# Patient Record
Sex: Male | Born: 1960 | Hispanic: No | Marital: Married | Smoking: Former smoker
Health system: Southern US, Community
[De-identification: ages and names within clinical notes are randomized; demographics above are authoritative.]

## PROBLEM LIST (undated history)

## (undated) DIAGNOSIS — G4730 Sleep apnea, unspecified: Secondary | ICD-10-CM

## (undated) DIAGNOSIS — Q249 Congenital malformation of heart, unspecified: Secondary | ICD-10-CM

## (undated) DIAGNOSIS — M1990 Unspecified osteoarthritis, unspecified site: Secondary | ICD-10-CM

## (undated) DIAGNOSIS — I10 Essential (primary) hypertension: Secondary | ICD-10-CM

## (undated) DIAGNOSIS — F32A Depression, unspecified: Secondary | ICD-10-CM

## (undated) HISTORY — DX: Unspecified osteoarthritis, unspecified site: M19.90

## (undated) HISTORY — PX: CARDIAC SURGERY: SHX584

## (undated) HISTORY — DX: Sleep apnea, unspecified: G47.30

## (undated) HISTORY — DX: Congenital malformation of heart, unspecified: Q24.9

## (undated) HISTORY — DX: Depression, unspecified: F32.A

---

## 2013-02-10 ENCOUNTER — Encounter: Payer: Self-pay | Admitting: Cardiovascular Disease

## 2013-02-10 ENCOUNTER — Inpatient Hospital Stay: Payer: Self-pay | Admitting: Specialist

## 2013-02-10 DIAGNOSIS — I2510 Atherosclerotic heart disease of native coronary artery without angina pectoris: Secondary | ICD-10-CM

## 2013-02-10 DIAGNOSIS — I214 Non-ST elevation (NSTEMI) myocardial infarction: Secondary | ICD-10-CM

## 2013-02-10 LAB — COMPREHENSIVE METABOLIC PANEL
Alkaline Phosphatase: 73 U/L (ref 50–136)
Anion Gap: 6 — ABNORMAL LOW (ref 7–16)
BUN: 15 mg/dL (ref 7–18)
Chloride: 108 mmol/L — ABNORMAL HIGH (ref 98–107)
Creatinine: 0.87 mg/dL (ref 0.60–1.30)
EGFR (African American): >60
Osmolality: 278 (ref 275–301)
Potassium: 3.8 mmol/L (ref 3.5–5.1)
SGOT(AST): 45 U/L — ABNORMAL HIGH (ref 15–37)

## 2013-02-10 LAB — DRUG SCREEN, URINE
Amphetamines, Ur Screen: NEGATIVE (ref ?–1000)
Benzodiazepine, Ur Scrn: NEGATIVE (ref ?–200)
Cannabinoid 50 Ng, Ur ~~LOC~~: POSITIVE (ref ?–50)
MDMA (Ecstasy)Ur Screen: NEGATIVE (ref ?–500)
Opiate, Ur Screen: NEGATIVE (ref ?–300)
Phencyclidine (PCP) Ur S: NEGATIVE (ref ?–25)

## 2013-02-10 LAB — CBC
MCHC: 33.5 g/dL (ref 32.0–36.0)
MCV: 84 fL (ref 80–100)
WBC: 7.9 10*3/uL (ref 3.8–10.6)

## 2013-02-10 LAB — CK TOTAL AND CKMB (NOT AT ARMC)
CK, Total: 195 U/L (ref 35–232)
CK-MB: 10.4 ng/mL — ABNORMAL HIGH (ref 0.5–3.6)
CK-MB: 6.1 ng/mL — ABNORMAL HIGH (ref 0.5–3.6)

## 2013-02-10 LAB — APTT: Activated PTT: 33.1 secs (ref 23.6–35.9)

## 2013-02-10 LAB — PROTIME-INR: Prothrombin Time: 13.1 secs (ref 11.5–14.7)

## 2013-02-11 LAB — BASIC METABOLIC PANEL
BUN: 11 mg/dL (ref 7–18)
Calcium, Total: 8 mg/dL — ABNORMAL LOW (ref 8.5–10.1)
Chloride: 107 mmol/L (ref 98–107)
Creatinine: 0.84 mg/dL (ref 0.60–1.30)
EGFR (African American): >60
EGFR (Non-African Amer.): >60
Osmolality: 277 (ref 275–301)
Potassium: 3.6 mmol/L (ref 3.5–5.1)

## 2013-02-11 LAB — CK TOTAL AND CKMB (NOT AT ARMC)
CK, Total: 114 U/L (ref 35–232)
CK-MB: 2.7 ng/mL (ref 0.5–3.6)

## 2013-03-03 ENCOUNTER — Ambulatory Visit: Payer: Self-pay | Admitting: Physician Assistant

## 2013-06-12 ENCOUNTER — Other Ambulatory Visit: Payer: Self-pay | Admitting: Cardiology

## 2013-06-12 LAB — CK TOTAL AND CKMB (NOT AT ARMC)
CK, Total: 109 U/L
CK-MB: 1.7 ng/mL

## 2013-06-12 LAB — TROPONIN I: Troponin-I: 0.8 ng/mL — ABNORMAL HIGH

## 2013-09-04 ENCOUNTER — Ambulatory Visit: Payer: Self-pay | Admitting: General Practice

## 2015-03-22 NOTE — Consult Note (Signed)
   General Aspect 54 yo male with history of tobacco, etoh and substance abuse who presented to the er after being noted to have mildly elevated serum troponin as outpatient of 0.3 after haveing left arm pain for several hours. In the er , his roponin was 11.0.  EKG does not suggest stemi. He is pain free and hemodynamically stable at present. FOrmer cocaine user but noe aat present. States he quit smoking several weeks ago.   Physical Exam:  GEN well developed, well nourished, no acute distress   HEENT PERRL, hearing intact to voice   NECK supple  No masses   RESP clear BS  no use of accessory muscles   CARD Regular rate and rhythm  No murmur   ABD denies tenderness  no liver/spleen enlargement  normal BS   LYMPH negative neck, negative axillae   EXTR negative cyanosis/clubbing, negative edema   SKIN normal to palpation   NEURO cranial nerves intact, motor/sensory function intact   PSYCH A+O to time, place, person   Review of Systems:  Subjective/Chief Complaint chest and left arm pain   General: No Complaints   Skin: No Complaints   ENT: No Complaints   Eyes: No Complaints   Neck: No Complaints   Respiratory: No Complaints   Cardiovascular: Chest pain or discomfort  Tightness   Gastrointestinal: No Complaints   Genitourinary: No Complaints   Vascular: No Complaints   Musculoskeletal: No Complaints   Neurologic: No Complaints   Hematologic: No Complaints   Endocrine: No Complaints   Psychiatric: No Complaints   Review of Systems: All other systems were reviewed and found to be negative   Medications/Allergies Reviewed Medications/Allergies reviewed   EKG:  EKG NSR   Abnormal NSSTTW changes    Hydrochlorothiazide: Angioedema  Triamterene: Angioedema   Impression 54 yo male with history of hypertension and tobacco and cocaine use who presented with chest pain. Has ruled in for a NSTEMI. Hemodynamically stable. Deneis current tobacco or cocaine  use.   Plan 1. Continue heparin and intergrilin 2. Continue asa, betal blcokers and ace i 3. Risk and benefit of cardiac cath explainted. 4. Proceed with cardiac cath. Further recs after procedure.   Electronic Signatures: Dalia HeadingFath, Kenneth A (MD)  (Signed 14-Mar-14 12:38)  Authored: General Aspect/Present Illness, History and Physical Exam, Review of System, EKG , Allergies, Impression/Plan   Last Updated: 14-Mar-14 12:38 by Dalia HeadingFath, Kenneth A (MD)

## 2015-03-22 NOTE — H&P (Signed)
PATIENT NAME:  Sean Durham, Sean Durham MR#:  [id number] DATE OF BIRTH:  1961-01-13  DATE OF ADMISSION:  02/10/2013  PRIMARY CARE PHYSICIAN: The patient does not have one.   CHIEF COMPLAINT: Left arm pain.   HISTORY OF PRESENT ILLNESS: This is a 54 year old male who presents to the Emergency Room due to left arm pain that began yesterday. The patient said he woke up with left arm pain. Also had some tightness in his neck area. He went to work, although his symptoms still persisted, and therefore he went to see [employer], at Dr. Drucie OpitzStrickland's office.  He was seen by a physician assistant there, had some blood work done. He also was noted to be hypertensive and was started on some low-dose Norvasc, which he has not currently filled. His blood work from the doctor's office came back to have a troponin of 0.3. He was therefore called back and told to come to the ER. The patient continues to have some left arm pain, but it has improved. Here in the ER, the patient's troponin was noted to be as high as 11. The patient is suspected to have a non-ST-elevation MI and therefore hospitalist services were contacted for further treatment and evaluation.   REVIEW OF SYSTEMS:  CONSTITUTIONAL: No documented fever. No weight gain or weight loss.  EYES: No blurred or double vision.  ENT: No tinnitus. No postnasal drip. No redness of the oropharynx.  RESPIRATORY: Positive cough. No wheeze. No hemoptysis. No dyspnea.  CARDIOVASCULAR: No chest pain. No orthopnea. No palpitations. No syncope.  GASTROINTESTINAL: No nausea. No vomiting. No diarrhea. No abdominal pain. No melena or hematochezia.  GENITOURINARY: No dysuria or hematuria.  ENDOCRINE: No polyuria or nocturia. No heat or cold intolerance. HEMATOLOGIC: No anemia. No bruising. No bleeding.  INTEGUMENTARY: No rashes. No lesions.  MUSCULOSKELETAL: No arthritis. No swelling. No gout.  NEUROLOGIC: No numbness or tingling. No ataxia. No seizure-type activity.   PSYCHIATRIC: No anxiety. No insomnia. No ADD.   PAST MEDICAL HISTORY:  Is consistent with history of polysubstance abuse including alcohol, cocaine, marijuana and tobacco abuse. Possibly past medical history of undiagnosed hypertension.   ALLERGIES: No known drug allergies.   SOCIAL HISTORY: Used to be a smoker and drinker and also cocaine abuse, but has been clean since January of 2014.   FAMILY HISTORY: The patient's father died from paraplegia and from a traumatic incident. Mother died from MI and vascular disease.   CURRENT MEDICATIONS: He is currently on no medications.   PHYSICAL EXAMINATION:  VITAL SIGNS:  Presently temperature is 97.8, pulse 76, respirations 16, blood pressure 159/103 and sats 98% on room air.  GENERAL: He is anxious-appearing male but in no apparent distress.  HEENT: Atraumatic, normocephalic. Extraocular muscles are intact. Pupils equal and reactive to light. Sclerae anicteric. No conjunctival injection. No pharyngeal erythema.  NECK: Supple. There is no jugular venous distention. No bruits and no lymphadenopathy or  thyromegaly.  HEART: Regular rate and rhythm. No murmurs, no rubs and no clicks.  LUNGS: Clear to auscultation bilaterally. No rales, no rhonchi and no wheezes.  ABDOMEN: Soft, flat, nontender and nondistended. Has good bowel sounds. No hepatosplenomegaly appreciated.  EXTREMITIES: No evidence of any cyanosis, clubbing or peripheral edema. Has +2 pedal and radial pulses bilaterally.  NEUROLOGICAL: The patient is alert, awake and oriented x 3 with no focal motor or sensory deficits appreciated bilaterally.  SKIN: Moist and warm with no rashes appreciated. LYMPHATIC:  No cervical or axillary lymphadenopathy.  LABORATORY DATA:  Serum glucose 99, BUN 15, creatinine 0.8, sodium 139, potassium 3.8, chloride 108, bicarbonate 25.  LFTs are within normal limits. Troponin 11, CK-MB 10.4 and total CK 195. Urine tox is positive for cannabis. White cell count  7.9, hemoglobin 14.2, hematocrit 42.6 and platelet count 337. INR 1.   DIAGNOSTICS:  The patient did have an EKG done which showed normal sinus rhythm with normal axis and no evidence of any acute ST or T wave changes.   The patient also had a chest x-ray done which showed no evidence of acute cardiopulmonary disease.   ASSESSMENT AND PLAN: This is a 54 year old male with a history of alcohol abuse, tobacco abuse and also cocaine abuse who presents to the hospital with left arm pain and also some neck pain and noted to have an elevated troponin of 11 and possibly a non-ST-elevation myocardial infarction.  1.  Non-ST-elevation myocardial infarction. This is the likely diagnosis given the patient's elevated troponin and left arm pain. He does have significant risk factors given history of tobacco abuse and alcohol abuse. He also has a strong family history of heart disease in his mother. For now I will place the patient on aspirin, heparin drip, Integrilin drip, put him on beta blocker, continue some low dose ACE inhibitor, and statin. We will get a 2-dimensional echocardiogram. I discussed the case with Dr. Lady Gary who will see the patient and possibly do a cardiac catheterization later today. I will go ahead and cycle his cardiac markers too.  2.  Malignant hypertension. This is likely secondary to undiagnosed hypertension which has not been treated and also complicated with possible underlying anxiety. I will give him some Xanax for his anxiety and start him on metoprolol for blood pressure along with lisinopril. We will also place him on IV hydralazine. He likely will need to be on some antihypertensives prior to discharge.  3.  Anxiety. Continue with some p.r.n. Xanax.   CODE STATUS: The patient is a FULL CODE.   TIME SPENT: 50 minutes.  ____________________________ Rolly Pancake. Cherlynn Kaiser, MD vjs:sb D: 02/10/2013 11:19:48 ET T: 02/10/2013 12:47:47 ET JOB#: [id number]  cc: Rolly Pancake. Cherlynn Kaiser, MD,  <Dictator> Houston Siren MD ELECTRONICALLY SIGNED 02/13/2013 8:17

## 2015-03-22 NOTE — Discharge Summary (Signed)
PATIENT NAME:  Sean Durham, Sean Durham MR#:  [id number] DATE OF BIRTH:  1961/08/06  DATE OF ADMISSION:  02/10/2013 DATE OF DISCHARGE:  02/11/2013  For a detailed note, please take a look at the history and physical done on admission by me yesterday.   DISCHARGE DIAGNOSES: As follows:  1.  Non-ST-elevation myocardial infarction, status post cardiac catheterization with stent to the mid circumflex.  2.  Malignant hypertension.  3.  Anxiety.  4.  History of polysubstance abuse.   DIET: The patient is being discharged on a low sodium, low fat diet.   ACTIVITY: As tolerated.   FOLLOWUP: With Dr. Lady GaryFath in the next 1 to 2 weeks.  DISCHARGE MEDICATIONS: As follows: Aspirin 81 mg daily, Plavix 75 mg daily, Pravachol 40 mg daily, lisinopril 5 mg daily, metoprolol tartrate 25 mg b.i.d.   CONSULTANTS DURING THE HOSPITAL COURSE: Dr. Mariel KanskyKen Fath from cardiology.   PERTINENT STUDIES DONE DURING THE HOSPITAL COURSE: As follows: A cardiac catheterization done on 02/10/2013 showing 25% stenosis in the mid LAD, proximal circumflex 25% stenosis, mid circumflex 90% stenosis, proximal RCA 20% stenosis and mid RCA 25% stenosis. The patient underwent a bare metal stent placement to the mid circumflex.   HOSPITAL COURSE: This is a 54 year old male with medical problems as mentioned above, who presented to the hospital with left arm pain and chest pain noted to have an elevated troponin of 11.    1.  Non-ST-elevation MI. This was a likely diagnosis given the patient's presenting symptoms of left arm pain and neck tightness and elevated troponin. Initially, the patient was started on aspirin, heparin drip, Integrilin drip along with beta blockers, ACE inhibitors and statin. The patient was seen by cardiology and underwent a cardiac catheterization and was found to have a mid circumflex stenosis to which a bare metal stent was placed. The patient was observed overnight in the CCU after the stent placement and has had no further  episodes of chest pain or any neck pain or left arm pain. He is; therefore, clinically stable, being discharged home on aspirin, beta blocker, statin, Plavix and ACE inhibitor as stated.  2.  Anxiety. The patient received some p.r.n. Xanax, but did not require it upon discharge.  3.  Malignant hypertension. The patient had significantly elevated blood pressures while in the hospital. He probably has undiagnosed hypertension, which has not been treated. He presently is being discharged on metoprolol and lisinopril as stated. This can be further addressed as an outpatient by his primary care physician.  4.  Hyperlipidemia. The patient was started on a statin, which he will continue to take given his non-ST elevation MI.   CODE STATUS: The patient is a full code.   TIME SPENT: 40 minutes.    ____________________________ Rolly PancakeVivek J. Cherlynn KaiserSainani, MD vjs:aw D: 02/11/2013 14:17:04 ET T: 02/12/2013 09:39:15 ET JOB#: [id number]  cc: Rolly PancakeVivek J. Cherlynn KaiserSainani, MD, <Dictator> Darlin PriestlyKenneth A. Lady GaryFath, MD Houston SirenVIVEK J SAINANI MD ELECTRONICALLY SIGNED 02/13/2013 8:18

## 2018-06-07 ENCOUNTER — Emergency Department
Admission: EM | Admit: 2018-06-07 | Discharge: 2018-06-07 | Disposition: A | Discharge status: Home / Self Care | Payer: PRIVATE HEALTH INSURANCE | Attending: Emergency Medicine | Admitting: Emergency Medicine

## 2018-06-07 ENCOUNTER — Encounter: Payer: Self-pay | Admitting: Emergency Medicine

## 2018-06-07 ENCOUNTER — Other Ambulatory Visit: Payer: Self-pay

## 2018-06-07 DIAGNOSIS — G4710 Hypersomnia, unspecified: Secondary | ICD-10-CM | POA: Insufficient documentation

## 2018-06-07 DIAGNOSIS — G47411 Narcolepsy with cataplexy: Secondary | ICD-10-CM

## 2018-06-07 DIAGNOSIS — G47421 Narcolepsy in conditions classified elsewhere with cataplexy: Secondary | ICD-10-CM | POA: Insufficient documentation

## 2018-06-07 LAB — COMPREHENSIVE METABOLIC PANEL
ALK PHOS: 49 U/L (ref 38–126)
ALT: 46 U/L — ABNORMAL HIGH (ref 0–44)
AST: 25 U/L (ref 15–41)
Albumin: 4.1 g/dL (ref 3.5–5.0)
Anion gap: 5 (ref 5–15)
BILIRUBIN TOTAL: 0.4 mg/dL (ref 0.3–1.2)
BUN: 11 mg/dL (ref 6–20)
CALCIUM: 9.3 mg/dL (ref 8.9–10.3)
CO2: 31 mmol/L (ref 22–32)
Chloride: 105 mmol/L (ref 98–111)
Creatinine, Ser: 1.03 mg/dL (ref 0.61–1.24)
GFR calc Af Amer: >60 mL/min (ref >60)
GLUCOSE: 104 mg/dL — AB (ref 70–99)
Potassium: 4.1 mmol/L (ref 3.5–5.1)
Sodium: 141 mmol/L (ref 135–145)
TOTAL PROTEIN: 7.2 g/dL (ref 6.5–8.1)

## 2018-06-07 LAB — CBC
HEMATOCRIT: 45.8 % (ref 40.0–52.0)
HEMOGLOBIN: 15.5 g/dL (ref 13.0–18.0)
MCH: 28.8 pg (ref 26.0–34.0)
MCHC: 33.8 g/dL (ref 32.0–36.0)
MCV: 85.2 fL (ref 80.0–100.0)
Platelets: 313 10*3/uL (ref 150–440)
RBC: 5.37 MIL/uL (ref 4.40–5.90)
RDW: 14.4 % (ref 11.5–14.5)
WBC: 6 10*3/uL (ref 3.8–10.6)

## 2018-06-07 MED ORDER — MODAFINIL 200 MG PO TABS: 200.0000 mg | ORAL_TABLET | Freq: Every day | ORAL | 0 refills | Status: DC

## 2018-06-07 NOTE — ED Provider Notes (Signed)
[facility] Emergency Department Provider Note   ____________________________________________    I have reviewed the triage vital signs and the nursing notes.   HISTORY  Chief Complaint Weakness     HPI Sean Durham is a 57 y.o. male who presents with complaints of falling asleep at all times.  Patient reports over the last month he finds himself falling asleep without meaning to while he was in the middle of an activity.  Typically happens when he is sitting down, sometimes he will be looking at his phone and then wake up an hour later.  He denies neuro complaints, no palpitations or chest pain.  No history of narcolepsy.   History reviewed. No pertinent past medical history.  There are no active problems to display for this patient.   History reviewed. No pertinent surgical history.  Prior to Admission medications   Medication Sig Start Date End Date Taking? Authorizing Provider  modafinil (PROVIGIL) 200 MG tablet Take 1 tablet (200 mg total) by mouth daily. 06/07/18 07/07/18  Jene Every, MD     Allergies Hydrochlorothiazide w-triamterene and Statins  No family history on file.  Social History No alcohol or drugs does not smoke Review of Systems  Constitutional: No fever/chills Eyes: No visual changes.  ENT: No sore throat. Cardiovascular: Denies chest pain. Respiratory: Denies shortness of breath. Gastrointestinal: No abdominal pain.  No nausea, no vomiting.   Genitourinary: Negative for dysuria. Musculoskeletal: Negative for back pain. Skin: Negative for rash. Neurological: Negative for headaches or weakness   ____________________________________________   PHYSICAL EXAM:  VITAL SIGNS: ED Triage Vitals [06/07/18 0910]  Enc Vitals Group     BP (!) 189/109     Pulse Rate 76     Resp 20     Temp 97.7 F (36.5 C)     Temp Source Oral     SpO2 95 %     Weight 108.9 kg (240 lb)     Height 1.753 m (5\' 9" )     Head  Circumference      Peak Flow      Pain Score 0     Pain Loc      Pain Edu?      Excl. in GC?     Constitutional: Alert and oriented. No acute distress. Pleasant and interactive Eyes: Conjunctivae are normal.   Nose: No congestion/rhinnorhea. Mouth/Throat: Mucous membranes are moist.    Cardiovascular: Normal rate, regular rhythm. Grossly normal heart sounds.  Good peripheral circulation. Respiratory: Normal respiratory effort.  No retractions. Lungs CTAB. Gastrointestinal: Soft and nontender. No distention.    Musculoskeletal: No lower extremity tenderness nor edema.  Warm and well perfused Neurologic:  Normal speech and language. No gross focal neurologic deficits are appreciated.  Skin:  Skin is warm, dry and intact. No rash noted. Psychiatric: Mood and affect are normal. Speech and behavior are normal.  ____________________________________________   LABS (all labs ordered are listed, but only abnormal results are displayed)  Labs Reviewed  COMPREHENSIVE METABOLIC PANEL - Abnormal; Notable for the following components:      Result Value   Glucose, Bld 104 (*)    ALT 46 (*)    All other components within normal limits  CBC   ____________________________________________  EKG  ED ECG REPORT I, Jene Every, the attending physician, personally viewed and interpreted this ECG.  Date: 06/07/2018  Rhythm: normal sinus rhythm QRS Axis: normal Intervals: normal ST/T Wave abnormalities: normal Narrative Interpretation: no evidence of  acute ischemia  ____________________________________________  RADIOLOGY  None ____________________________________________   PROCEDURES  Procedure(s) performed: No  Procedures   Critical Care performed: No ____________________________________________   INITIAL IMPRESSION / ASSESSMENT AND PLAN / ED COURSE  Pertinent labs & imaging results that were available during my care of the patient were reviewed by me and considered  in my medical decision making (see chart for details).  Patient well-appearing in no acute distress.  Symptoms do not appear consistent with syncope or cardiac.  Certainly his symptoms are concerning for narcolepsy although this would be unusual at his age.  Seems to have episodes of cataplexy given normal work-up I think the patient is appropriate for discharge, will try Provigil and have the patient follow-up closely with internal medicine for further work-up      ____________________________________________   FINAL CLINICAL IMPRESSION(S) / ED DIAGNOSES  Final diagnoses:  Primary narcolepsy with cataplexy        Note:  This document was prepared using Dragon voice recognition software and may include unintentional dictation errors.    Jene EveryKinner, [place] Mallozzi, MD 06/07/18 [phone]

## 2018-06-07 NOTE — ED Notes (Addendum)

## 2018-06-07 NOTE — ED Triage Notes (Signed)
Pt reports has been randomly falling asleep for the past few months. Pt states that he can be be doing anything and then he just falls asleep like someone flipped a switch. Pt denies pain or other sx's.

## 2018-06-20 ENCOUNTER — Ambulatory Visit
Admission: EM | Admit: 2018-06-20 | Discharge: 2018-06-20 | Disposition: A | Discharge status: Home / Self Care | Payer: PRIVATE HEALTH INSURANCE | Attending: Family Medicine | Admitting: Family Medicine

## 2018-06-20 ENCOUNTER — Encounter: Payer: Self-pay | Admitting: Emergency Medicine

## 2018-06-20 ENCOUNTER — Other Ambulatory Visit: Payer: Self-pay

## 2018-06-20 DIAGNOSIS — H10021 Other mucopurulent conjunctivitis, right eye: Secondary | ICD-10-CM

## 2018-06-20 DIAGNOSIS — L03213 Periorbital cellulitis: Secondary | ICD-10-CM

## 2018-06-20 DIAGNOSIS — H00012 Hordeolum externum right lower eyelid: Secondary | ICD-10-CM

## 2018-06-20 HISTORY — DX: Essential (primary) hypertension: I10

## 2018-06-20 MED ORDER — ERYTHROMYCIN 5 MG/GM OP OINT: TOPICAL_OINTMENT | OPHTHALMIC | 1 refills | Status: DC

## 2018-06-20 MED ORDER — CEFAZOLIN SODIUM 1 G IJ SOLR
1.0000 g | Freq: Once | INTRAMUSCULAR | Status: AC
  Administered 2018-06-20: 1 g via INTRAMUSCULAR

## 2018-06-20 MED ORDER — AMOXICILLIN-POT CLAVULANATE 875-125 MG PO TABS: 1.0000 | ORAL_TABLET | Freq: Two times a day (BID) | ORAL | 0 refills | Status: DC

## 2018-06-20 NOTE — ED Triage Notes (Signed)
Patient in today c/o right eye swelling, pain and redness. Patient states his eye was matted shut this morning. Patient states symptoms began on Saturday (06/18/18) with just a little scratching.

## 2018-06-20 NOTE — ED Provider Notes (Signed)
[facility]    CSN: [id number] Arrival date & time: 06/20/18  1530     History   Chief Complaint Chief Complaint  Patient presents with  . Eye Pain    right    HPI Sean Durham is a 57 y.o. male.   57 yo male with a c/o right lower eyelid swelling, redness, and tenderness associated also with eye drainage and redness. Denies any trauma, foreign body, fevers, chills.   The history is provided by the patient.  Eye Pain     Past Medical History:  Diagnosis Date  . Hypertension     There are no active problems to display for this patient.   History reviewed. No pertinent surgical history.     Home Medications    Prior to Admission medications   Medication Sig Start Date End Date Taking? Authorizing Provider  lisinopril (PRINIVIL,ZESTRIL) 10 MG tablet Take 1 tablet by mouth daily. 08/16/17 08/16/18 Yes [provider]  modafinil (PROVIGIL) 200 MG tablet Take 1 tablet (200 mg total) by mouth daily. 06/07/18 07/07/18 Yes Jene EveryKinner, Robert, MD  amoxicillin-clavulanate (AUGMENTIN) 875-125 MG tablet Take 1 tablet by mouth 2 (two) times daily. 06/20/18   Payton Mccallumonty, Cleva Camero, MD  erythromycin ophthalmic ointment Place a 1/2 inch ribbon of ointment into the lower eyelid tid 06/20/18   Payton Mccallumonty, Latoshia Monrroy, MD    Family History Family History  Problem Relation Age of Onset  . Heart disease Mother   . Aneurysm Mother        back side of spine  . Other Father        fall - broken neck  . Emphysema Father     Social History Social History   Tobacco Use  . Smoking status: Former Smoker    Last attempt to quit: 06/20/2010    Years since quitting: 8.0  . Smokeless tobacco: Never Used  Substance Use Topics  . Alcohol use: Yes    Comment: rarely  . Drug use: Never     Allergies   Hydrochlorothiazide w-triamterene; Nicotine; and Statins   Review of Systems Review of Systems  Eyes: Positive for pain.     Physical Exam Triage Vital Signs ED Triage Vitals  [06/20/18 1548]  Enc Vitals Group     BP (!) 164/98     Pulse Rate 96     Resp 16     Temp 98.4 F (36.9 C)     Temp Source Oral     SpO2 96 %     Weight 240 lb (108.9 kg)     Height 5\' 9"  (1.753 m)     Head Circumference      Peak Flow      Pain Score 6     Pain Loc      Pain Edu?      Excl. in GC?    No data found.  Updated Vital Signs BP (!) 164/98 (BP Location: Left Arm)   Pulse 96   Temp 98.4 F (36.9 C) (Oral)   Resp 16   Ht 5\' 9"  (1.753 m)   Wt 240 lb (108.9 kg)   SpO2 96%   BMI 35.44 kg/m   Visual Acuity Right Eye Distance: 20/30(corrected) Left Eye Distance: 20/20(corrected) Bilateral Distance: 20/20(corrected)  Right Eye Near:   Left Eye Near:    Bilateral Near:     Physical Exam  Constitutional: He appears well-developed and well-nourished. No distress.  Eyes: Pupils are equal, round, and reactive to light.  EOM are normal. Right conjunctiva is injected.  Right lower eyelid edematous, erythematous and tender to palpation  Skin: He is not diaphoretic.  Nursing note and vitals reviewed.    UC Treatments / Results  Labs (all labs ordered are listed, but only abnormal results are displayed) Labs Reviewed - No data to display  EKG None  Radiology No results found.  Procedures Procedures (including critical care time)  Medications Ordered in UC Medications  ceFAZolin (ANCEF) injection 1 g (has no administration in time range)    Initial Impression / Assessment and Plan / UC Course  I have reviewed the triage vital signs and the nursing notes.  Pertinent labs & imaging results that were available during my care of the patient were reviewed by me and considered in my medical decision making (see chart for details).     Final Clinical Impressions(s) / UC Diagnoses   Final diagnoses:  Preseptal cellulitis of right eye  Other mucopurulent conjunctivitis of right eye  Hordeolum externum of right lower eyelid    ED Prescriptions     Medication Sig Dispense Auth. Provider   amoxicillin-clavulanate (AUGMENTIN) 875-125 MG tablet Take 1 tablet by mouth 2 (two) times daily. 20 tablet Payton Mccallum, MD   erythromycin ophthalmic ointment Place a 1/2 inch ribbon of ointment into the lower eyelid tid 3.5 g Payton Mccallum, MD     1. diagnosis reviewed with patient; patient given ancef 1gm IM x 1 2. rx as per orders above; reviewed possible side effects, interactions, risks and benefits  3. Recommend supportive treatment with warm compresses 4. Follow-up prn if symptoms worsen or don't improve  Controlled Substance Prescriptions [place] Controlled Substance Registry consulted? Not Applicable   Payton Mccallum, MD 06/20/18 1655

## 2018-11-15 ENCOUNTER — Encounter: Payer: Self-pay | Admitting: Urology

## 2018-11-15 ENCOUNTER — Ambulatory Visit: Payer: Self-pay | Admitting: Urology

## 2020-02-27 ENCOUNTER — Other Ambulatory Visit: Payer: Self-pay

## 2020-02-27 ENCOUNTER — Encounter: Payer: Self-pay | Admitting: Emergency Medicine

## 2020-02-27 ENCOUNTER — Ambulatory Visit
Admission: EM | Admit: 2020-02-27 | Discharge: 2020-02-27 | Disposition: A | Discharge status: Home / Self Care | Payer: HRSA Program | Attending: Family Medicine | Admitting: Family Medicine

## 2020-02-27 DIAGNOSIS — Z7901 Long term (current) use of anticoagulants: Secondary | ICD-10-CM | POA: Diagnosis not present

## 2020-02-27 DIAGNOSIS — I10 Essential (primary) hypertension: Secondary | ICD-10-CM | POA: Insufficient documentation

## 2020-02-27 DIAGNOSIS — Z79899 Other long term (current) drug therapy: Secondary | ICD-10-CM | POA: Diagnosis not present

## 2020-02-27 DIAGNOSIS — U071 COVID-19: Secondary | ICD-10-CM | POA: Diagnosis not present

## 2020-02-27 DIAGNOSIS — I252 Old myocardial infarction: Secondary | ICD-10-CM | POA: Diagnosis not present

## 2020-02-27 DIAGNOSIS — Z87891 Personal history of nicotine dependence: Secondary | ICD-10-CM | POA: Diagnosis not present

## 2020-02-27 DIAGNOSIS — R5381 Other malaise: Secondary | ICD-10-CM

## 2020-02-27 DIAGNOSIS — E785 Hyperlipidemia, unspecified: Secondary | ICD-10-CM | POA: Diagnosis not present

## 2020-02-27 DIAGNOSIS — I2510 Atherosclerotic heart disease of native coronary artery without angina pectoris: Secondary | ICD-10-CM | POA: Diagnosis not present

## 2020-02-27 LAB — CBC WITH DIFFERENTIAL/PLATELET
Abs Immature Granulocytes: 0.06 10*3/uL (ref 0.00–0.07)
Basophils Absolute: 0 10*3/uL (ref 0.0–0.1)
Basophils Relative: 0 %
Eosinophils Absolute: 0.1 10*3/uL (ref 0.0–0.5)
Eosinophils Relative: 1 %
HCT: 45.8 % (ref 39.0–52.0)
Hemoglobin: 14.8 g/dL (ref 13.0–17.0)
Immature Granulocytes: 1 %
Lymphocytes Relative: 25 %
Lymphs Abs: 1.6 10*3/uL (ref 0.7–4.0)
MCH: 27.3 pg (ref 26.0–34.0)
MCHC: 32.3 g/dL (ref 30.0–36.0)
MCV: 84.3 fL (ref 80.0–100.0)
Monocytes Absolute: 0.8 10*3/uL (ref 0.1–1.0)
Monocytes Relative: 13 %
Neutro Abs: 3.9 10*3/uL (ref 1.7–7.7)
Neutrophils Relative %: 60 %
Platelets: 408 10*3/uL — ABNORMAL HIGH (ref 150–400)
RBC: 5.43 MIL/uL (ref 4.22–5.81)
RDW: 13.5 % (ref 11.5–15.5)
WBC: 6.5 10*3/uL (ref 4.0–10.5)
nRBC: 0 % (ref 0.0–0.2)

## 2020-02-27 LAB — COMPREHENSIVE METABOLIC PANEL
ALT: 29 U/L (ref 0–44)
AST: 24 U/L (ref 15–41)
Albumin: 3.6 g/dL (ref 3.5–5.0)
Alkaline Phosphatase: 59 U/L (ref 38–126)
Anion gap: 9 (ref 5–15)
BUN: 15 mg/dL (ref 6–20)
CO2: 28 mmol/L (ref 22–32)
Calcium: 9 mg/dL (ref 8.9–10.3)
Chloride: 101 mmol/L (ref 98–111)
Creatinine, Ser: 0.9 mg/dL (ref 0.61–1.24)
GFR calc Af Amer: >60 mL/min (ref >60)
GFR calc non Af Amer: >60 mL/min (ref >60)
Glucose, Bld: 104 mg/dL — ABNORMAL HIGH (ref 70–99)
Potassium: 4.3 mmol/L (ref 3.5–5.1)
Sodium: 138 mmol/L (ref 135–145)
Total Bilirubin: 0.5 mg/dL (ref 0.3–1.2)
Total Protein: 7.5 g/dL (ref 6.5–8.1)

## 2020-02-27 LAB — LIPASE, BLOOD: Lipase: 23 U/L (ref 11–51)

## 2020-02-27 NOTE — ED Triage Notes (Signed)
Pt states that he feels bad like he has the flu is all he can say. But he says he is anxious and does not feel him self. He has had thought of hurting himself but no plan. Pt can not pin point one thing that is bothering him. He states he was tested for covid recently and was negative. He states he is about to loss his job.

## 2020-02-27 NOTE — Discharge Instructions (Signed)
Labs negative.  Awaiting COVID test results.  Please follow up with [facility] clinic.  Take care  Dr. Adriana Simas

## 2020-02-27 NOTE — ED Provider Notes (Signed)
[facility]    CSN: [id number] Arrival date & time: 02/27/20  1227      History   Chief Complaint Chief Complaint  Patient presents with  . Anxiety   HPI   59 year old male presents with nonspecific complaints.  Patient states that he has not been feeling well since March 18.  Patient has difficulty explaining what exactly is bothering him.  Patient states that he feels like he has the flu.  When asked to further explain this, he requires prompting with questions to give answers about symptoms.  He reports dark urine, change in taste.  He reports fever, T-max 100.  No documented fever currently.  Denies abdominal pain.  Denies chest pain.  Reports that he has had some dizziness and shortness of breath.  He had a Covid test that was negative recently.  He was notified of negative results on 3/23.  No reported sick contacts.  No relieving factors.  Denies respiratory symptoms.  No other reported symptoms.  No other complaints.   PMH: CAD (coronary artery disease)  status-post non ST elevation with myocardial infarction. status-post PCI with placement of a bare metal stent in the mid circumflex.   Hypertension    Hyperlipidemia    Myocardial infarction (CMS-HCC)    Polysubstance abuse (CMS-HCC)    Anxiety      Surgical Hx: CARDIAC CATHETERIZATION      CORONARY ANGIOPLASTY       Home Medications    Prior to Admission medications   Medication Sig Start Date End Date Taking? Authorizing Provider  lisinopril (PRINIVIL,ZESTRIL) 10 MG tablet Take 1 tablet by mouth daily. 08/16/17 02/27/20 Yes [provider]  amoxicillin-clavulanate (AUGMENTIN) 875-125 MG tablet Take 1 tablet by mouth 2 (two) times daily. 06/20/18   Payton Mccallum, MD  erythromycin ophthalmic ointment Place a 1/2 inch ribbon of ointment into the lower eyelid tid 06/20/18   Payton Mccallum, MD  modafinil (PROVIGIL) 200 MG tablet Take 1 tablet (200 mg total) by mouth daily. 06/07/18 07/07/18   Jene Every, MD    Family History Family History  Problem Relation Age of Onset  . Heart disease Mother   . Aneurysm Mother        back side of spine  . Other Father        fall - broken neck  . Emphysema Father     Social History Social History   Tobacco Use  . Smoking status: Former Smoker    Quit date: 06/20/2010    Years since quitting: 9.6  . Smokeless tobacco: Current User    Types: Chew  Substance Use Topics  . Alcohol use: Yes    Comment: rarely  . Drug use: Never     Allergies   Hydrochlorothiazide w-triamterene, Nicotine, and Statins   Review of Systems Review of Systems Per HPI  Physical Exam Triage Vital Signs ED Triage Vitals  Enc Vitals Group     BP 02/27/20 1312 (!) 142/95     Pulse Rate 02/27/20 1312 93     Resp 02/27/20 1312 18     Temp 02/27/20 1312 98.4 F (36.9 C)     Temp Source 02/27/20 1312 Oral     SpO2 02/27/20 1312 97 %     Weight 02/27/20 1314 240 lb 1.3 oz (108.9 kg)     Height 02/27/20 1314 5\' 9"  (1.753 m)     Head Circumference --      Peak Flow --  Pain Score 02/27/20 1318 0     Pain Loc --      Pain Edu? --      Excl. in [place]? --    Updated Vital Signs BP (!) 142/95 (BP Location: Left Arm)   Pulse 93   Temp 98.4 F (36.9 C) (Oral)   Resp 18   Ht 5\' 9"  (1.753 m)   Wt 108.9 kg   SpO2 97%   BMI 35.45 kg/m   Visual Acuity Right Eye Distance:   Left Eye Distance:   Bilateral Distance:    Right Eye Near:   Left Eye Near:    Bilateral Near:     Physical Exam Vitals and nursing note reviewed.  Constitutional:      General: He is not in acute distress.    Appearance: Normal appearance. He is not ill-appearing.  HENT:     Head: Normocephalic and atraumatic.  Eyes:     General:        Right eye: No discharge.        Left eye: No discharge.     Conjunctiva/sclera: Conjunctivae normal.  Cardiovascular:     Rate and Rhythm: Normal rate and regular rhythm.     Heart sounds: No murmur.  Pulmonary:      Effort: Pulmonary effort is normal.     Breath sounds: Normal breath sounds. No wheezing, rhonchi or rales.  Abdominal:     General: There is no distension.     Palpations: Abdomen is soft.     Tenderness: There is no abdominal tenderness. There is no guarding or rebound.  Neurological:     Mental Status: He is alert.    UC Treatments / Results  Labs (all labs ordered are listed, but only abnormal results are displayed) Labs Reviewed  CBC WITH DIFFERENTIAL/PLATELET - Abnormal; Notable for the following components:      Result Value   Platelets 408 (*)    All other components within normal limits  COMPREHENSIVE METABOLIC PANEL - Abnormal; Notable for the following components:   Glucose, Bld 104 (*)    All other components within normal limits  SARS CORONAVIRUS 2 (TAT 6-24 HRS)  LIPASE, BLOOD    EKG   Radiology No results found.  Procedures Procedures (including critical care time)  Medications Ordered in UC Medications - No data to display  Initial Impression / Assessment and Plan / UC Course  I have reviewed the triage vital signs and the nursing notes.  Pertinent labs & imaging results that were available during my care of the patient were reviewed by me and considered in my medical decision making (see chart for details).    59 year old male presents with malaise.  Labs unremarkable.  Awaiting Covid test results.  Supportive care.  Advised close follow-up with PCP.    Final Clinical Impressions(s) / UC Diagnoses   Final diagnoses:  Malaise     Discharge Instructions     Labs negative.  Awaiting COVID test results.  Please follow up with [facility] clinic.  Take care  Dr. Lacinda Axon    ED Prescriptions    None     PDMP not reviewed this encounter.   Coral Spikes, [place] 02/27/20 1622

## 2020-02-28 LAB — SARS CORONAVIRUS 2 (TAT 6-24 HRS): SARS Coronavirus 2: POSITIVE — AB

## 2020-02-29 ENCOUNTER — Telehealth: Payer: Self-pay | Admitting: Physician Assistant

## 2020-02-29 NOTE — Telephone Encounter (Signed)
Called to discuss with Sean Durham about Covid symptoms and the use of bamlanivimab, a monoclonal antibody infusion for those with mild to moderate Covid symptoms and at a high risk of hospitalization.     Pt is qualified for this infusion at the [facility] infusion center due to co-morbid conditions and/or a member of an at-risk group, however wants to think about it. Symptoms tier reviewed as well as criteria for ending isolation.  Symptoms reviewed that would warrant ED/Hospital evaluation. Preventative practices reviewed. Patient verbalized understanding. Patient advised to call back if he decides that he does want to get infusion. Callback number to the infusion center given. Patient advised to go to Urgent care or ED with severe symptoms.   Patient reports his symptoms started on March 17th or 19th however had negative COVID test on 23th. He went to urgent care on 30th and tested positive. Will count his symptoms onset from 24th given prior negative test.   Hx of CAD and HTN.   Manson Passey, [place]

## 2020-06-25 ENCOUNTER — Ambulatory Visit (INDEPENDENT_AMBULATORY_CARE_PROVIDER_SITE_OTHER): Payer: 59

## 2020-06-25 ENCOUNTER — Ambulatory Visit
Admission: RE | Admit: 2020-06-25 | Discharge: 2020-06-25 | Disposition: A | Discharge status: Home / Self Care | Payer: 59 | Source: Ambulatory Visit | Attending: Family Medicine | Admitting: Family Medicine

## 2020-06-25 ENCOUNTER — Other Ambulatory Visit: Payer: Self-pay

## 2020-06-25 ENCOUNTER — Ambulatory Visit
Admission: EM | Admit: 2020-06-25 | Discharge: 2020-06-25 | Disposition: A | Discharge status: Home / Self Care | Payer: 59 | Attending: Family Medicine | Admitting: Family Medicine

## 2020-06-25 DIAGNOSIS — Z79899 Other long term (current) drug therapy: Secondary | ICD-10-CM | POA: Diagnosis not present

## 2020-06-25 DIAGNOSIS — Z95818 Presence of other cardiac implants and grafts: Secondary | ICD-10-CM | POA: Diagnosis not present

## 2020-06-25 DIAGNOSIS — Z888 Allergy status to other drugs, medicaments and biological substances status: Secondary | ICD-10-CM | POA: Insufficient documentation

## 2020-06-25 DIAGNOSIS — Z87891 Personal history of nicotine dependence: Secondary | ICD-10-CM | POA: Diagnosis not present

## 2020-06-25 DIAGNOSIS — I10 Essential (primary) hypertension: Secondary | ICD-10-CM | POA: Diagnosis not present

## 2020-06-25 DIAGNOSIS — J988 Other specified respiratory disorders: Secondary | ICD-10-CM

## 2020-06-25 DIAGNOSIS — R05 Cough: Secondary | ICD-10-CM | POA: Diagnosis present

## 2020-06-25 DIAGNOSIS — Z20822 Contact with and (suspected) exposure to covid-19: Secondary | ICD-10-CM | POA: Insufficient documentation

## 2020-06-25 DIAGNOSIS — R52 Pain, unspecified: Secondary | ICD-10-CM | POA: Diagnosis not present

## 2020-06-25 DIAGNOSIS — J029 Acute pharyngitis, unspecified: Secondary | ICD-10-CM | POA: Insufficient documentation

## 2020-06-25 DIAGNOSIS — B9789 Other viral agents as the cause of diseases classified elsewhere: Secondary | ICD-10-CM | POA: Diagnosis not present

## 2020-06-25 LAB — SARS CORONAVIRUS 2 (TAT 6-24 HRS): SARS Coronavirus 2: NEGATIVE

## 2020-06-25 MED ORDER — IPRATROPIUM BROMIDE 0.06 % NA SOLN: 2.0000 | Freq: Four times a day (QID) | NASAL | 0 refills | Status: DC | PRN

## 2020-06-25 MED ORDER — BENZONATATE 200 MG PO CAPS: 200.0000 mg | ORAL_CAPSULE | Freq: Three times a day (TID) | ORAL | 0 refills | Status: DC | PRN

## 2020-06-25 NOTE — Discharge Instructions (Addendum)
Rest.  Lots of fluids.  Awaiting on COVID test result.  Medication as prescribed.  Take care  Dr. Adriana Simas

## 2020-06-25 NOTE — ED Triage Notes (Signed)
Patient states that he has been coughing, irritated throat, body aches, chills, sweats and runny nose since Saturday. Patient states that he took Mucinex and helped some.

## 2020-06-25 NOTE — ED Notes (Signed)
Patient has been approved for CPT 71250. Obtained via [facility] . Valid for 06/25/2020 through 09/23/2020. Case Auth #[id number].

## 2020-06-25 NOTE — ED Provider Notes (Signed)
[facility]    CSN: [id number] Arrival date & time: 06/25/20  0831      History   Chief Complaint Chief Complaint  Patient presents with  . Cough   HPI  59 year old male presents with respiratory symptoms.  Symptoms started on Saturday.  He reports cough, sore throat, body aches, chills, sweats, runny nose.  No documented fever.  He has taken some Mucinex with some improvement.  No reported sick contacts.  No known inciting factor.  Patient states that he feels slightly improved today.  He is a former smoker.  No other associated symptoms.  No other complaints.  Past Medical History:  Diagnosis Date  . Hypertension    Past Surgical History:  Procedure Laterality Date  . CARDIAC SURGERY     stent placement   Home Medications    Prior to Admission medications   Medication Sig Start Date End Date Taking? Authorizing Provider  lisinopril (PRINIVIL,ZESTRIL) 10 MG tablet Take 1 tablet by mouth daily. 08/16/17 06/25/20 Yes [provider]  metoprolol succinate (TOPROL-XL) 25 MG 24 hr tablet Take by mouth. 06/06/19  Yes [provider]  benzonatate (TESSALON) 200 MG capsule Take 1 capsule (200 mg total) by mouth 3 (three) times daily as needed for cough. 06/25/20   Tommie Sams, DO  ipratropium (ATROVENT) 0.06 % nasal spray Place 2 sprays into both nostrils 4 (four) times daily as needed for rhinitis. 06/25/20   Tommie Sams, DO  modafinil (PROVIGIL) 200 MG tablet Take 1 tablet (200 mg total) by mouth daily. 06/07/18 07/07/18  Jene Every, MD    Family History Family History  Problem Relation Age of Onset  . Heart disease Mother   . Aneurysm Mother        back side of spine  . Other Father        fall - broken neck  . Emphysema Father     Social History Social History   Tobacco Use  . Smoking status: Former Smoker    Quit date: 06/20/2010    Years since quitting: 10.0  . Smokeless tobacco: Current User    Types: Chew  Vaping Use  . Vaping  Use: Some days  Substance Use Topics  . Alcohol use: Yes    Comment: rarely  . Drug use: Never     Allergies   Hydrochlorothiazide w-triamterene, Nicotine, and Statins   Review of Systems Review of Systems Per HPI  Physical Exam Triage Vital Signs ED Triage Vitals  Enc Vitals Group     BP 06/25/20 0857 (!) 141/89     Pulse Rate 06/25/20 0857 83     Resp 06/25/20 0857 18     Temp 06/25/20 0857 98.7 F (37.1 C)     Temp Source 06/25/20 0857 Oral     SpO2 06/25/20 0857 99 %     Weight 06/25/20 0856 (!) 220 lb (99.8 kg)     Height 06/25/20 0856 5' 9.5" (1.765 m)     Head Circumference --      Peak Flow --      Pain Score 06/25/20 0856 0     Pain Loc --      Pain Edu? --      Excl. in [place]? --    Updated Vital Signs BP (!) 141/89 (BP Location: Left Arm)   Pulse 83   Temp 98.7 F (37.1 C) (Oral)   Resp 18   Ht 5' 9.5" (1.765 m)   Wt (!)  99.8 kg   SpO2 99%   BMI 32.02 kg/m   Visual Acuity Right Eye Distance:   Left Eye Distance:   Bilateral Distance:    Right Eye Near:   Left Eye Near:    Bilateral Near:     Physical Exam Vitals and nursing note reviewed.  Constitutional:      General: He is not in acute distress.    Appearance: Normal appearance. He is not ill-appearing.  HENT:     Head: Normocephalic and atraumatic.     Right Ear: Tympanic membrane normal.     Left Ear: Tympanic membrane normal.     Mouth/Throat:     Pharynx: Oropharynx is clear. No oropharyngeal exudate.  Eyes:     General:        Right eye: No discharge.        Left eye: No discharge.     Conjunctiva/sclera: Conjunctivae normal.  Cardiovascular:     Rate and Rhythm: Normal rate and regular rhythm.     Heart sounds: No murmur heard.   Pulmonary:     Effort: Pulmonary effort is normal.     Breath sounds: Normal breath sounds. No wheezing or rales.  Neurological:     Mental Status: He is alert.  Psychiatric:        Mood and Affect: Mood normal.        Behavior: Behavior  normal.    UC Treatments / Results  Labs (all labs ordered are listed, but only abnormal results are displayed) Labs Reviewed  SARS CORONAVIRUS 2 (TAT 6-24 HRS)    EKG   Radiology DG Chest 2 View  Result Date: 06/25/2020 CLINICAL DATA:  Cough and chills EXAM: CHEST - 2 VIEW COMPARISON:  February 10, 2013 FINDINGS: There is a questionable nodular opacity in the extreme left base region measuring 1.4 x 1.3 cm. Lungs elsewhere clear. Heart size and pulmonary vascularity are normal. No adenopathy. There is stable anterior wedging of a midthoracic vertebral body. IMPRESSION: Somewhat nodular appearing opacity measuring 1.4 x 1.3 cm, seen only on the frontal view overlying the extreme lung base region. It is possible that this opacity actually resides within the abdomen. Given questionable pulmonary nodular lesion, it would be prudent to correlate with apical lordotic chest radiograph or noncontrast enhanced chest CT to further assess. Lungs elsewhere clear.  Cardiac silhouette within normal limits. These results will be called to the ordering clinician or representative by the Radiologist Assistant, and communication documented in the PACS or [employer]. Electronically Signed   By: Bretta Bang III M.D.   On: 06/25/2020 09:37    Procedures Procedures (including critical care time)  Medications Ordered in UC Medications - No data to display  Initial Impression / Assessment and Plan / UC Course  I have reviewed the triage vital signs and the nursing notes.  Pertinent labs & imaging results that were available during my care of the patient were reviewed by me and considered in my medical decision making (see chart for details).    59 year old male presents with a suspected viral respiratory infection.  Awaiting Covid test result.  Chest x-ray obtained today and did not reveal an infiltrate.  There was a nodular appearing opacity in the "extreme lung base".  Could be in the abdomen.   Follow-up CT is recommended by radiology.  Will inform the patient.  Atrovent nasal spray and Tessalon Perles as directed.  Supportive care.  Final Clinical Impressions(s) / UC Diagnoses   Final  diagnoses:  Viral respiratory infection     Discharge Instructions     Rest.  Lots of fluids.  Awaiting on COVID test result.  Medication as prescribed.  Take care  Dr. Adriana Simas     ED Prescriptions    Medication Sig Dispense Auth. Provider   ipratropium (ATROVENT) 0.06 % nasal spray Place 2 sprays into both nostrils 4 (four) times daily as needed for rhinitis. 15 mL Michaella Imai G, DO   benzonatate (TESSALON) 200 MG capsule Take 1 capsule (200 mg total) by mouth 3 (three) times daily as needed for cough. 30 capsule Tommie Sams, DO     PDMP not reviewed this encounter.   Everlene Other [place], [place] 06/25/20 [phone]

## 2020-06-26 ENCOUNTER — Ambulatory Visit: Payer: 59

## 2020-11-20 ENCOUNTER — Ambulatory Visit: Payer: 59 | Admitting: Hospice and Palliative Medicine

## 2020-11-20 ENCOUNTER — Encounter: Payer: Self-pay | Admitting: Hospice and Palliative Medicine

## 2020-11-20 ENCOUNTER — Other Ambulatory Visit: Payer: Self-pay

## 2020-11-20 VITALS — BP 144/96 | HR 87 | Temp 97.7°F | Resp 16 | Ht 69.0 in | Wt 234.8 lb

## 2020-11-20 DIAGNOSIS — Z7689 Persons encountering health services in other specified circumstances: Secondary | ICD-10-CM | POA: Diagnosis not present

## 2020-11-20 DIAGNOSIS — I2510 Atherosclerotic heart disease of native coronary artery without angina pectoris: Secondary | ICD-10-CM

## 2020-11-20 DIAGNOSIS — K429 Umbilical hernia without obstruction or gangrene: Secondary | ICD-10-CM

## 2020-11-20 DIAGNOSIS — N529 Male erectile dysfunction, unspecified: Secondary | ICD-10-CM | POA: Diagnosis not present

## 2020-11-20 NOTE — Progress Notes (Signed)
[facility] [address] [place], [place] [address]  Internal MEDICINE  Office Visit Note  Patient Name: Sean Durham  [id number]  [id number]  Date of Service: 11/26/2020   Complaints/HPI Pt is here for establishment of PCP. Chief Complaint  Patient presents with  . New Patient (Initial Visit)    High BP, belly button  . Hypertension  . Sleep Apnea  . Quality Metric Gaps    colonoscopy   HPI  Patient is here to establish care with PCP Has not seen PCP in many years Comes in today wanting a physical and to discuss a few complaints -Concerned about his blood pressure-currently taking lisinopril as well as metoprolol, was started on this by cardiology a few months back History of CAD with stent placement many years ago--followed by cardiology, stable at this time -Hernia--a few months age he had an accident at work where he avoided a piece of heavy equipment from falling on him, was able to push equipment off but felt a pain in his abdomen and since event has noticed he has a hernia at his bellybutton and bulging area down entire mid abdomen No longer experiencing pain from hernia but feels that overtime it has gotten bigger -Erectile dysfunction, a few months ago had a warming sensation in his groin area and obtained an erection but noticed his penis was misshapen--curved to the right, unable to have sexual intercourse due to curve, no urinary complications or complications with ejaculation Denies penile or testicular pain  History of OSA--unable to tolerate CPAP, has been wearing over the counter nasal strips that have significantly improved his sleep and snoring  History of cigarette smoking and heavy alcohol drinking-stopped smoking and drinking about 8 years ago---only socially drinks now  Works full-time as Loss adjuster, chartered, job is labor intensive, does not routinely get extra physical activity other than work, tries to eat healthy most of the  time Denies headaches, chest pain, shortness of breath related to elevated BP  Current Medication: Outpatient Encounter Medications as of 11/20/2020  Medication Sig  . lisinopril (PRINIVIL,ZESTRIL) 10 MG tablet Take 1 tablet by mouth daily.  . metoprolol succinate (TOPROL-XL) 25 MG 24 hr tablet Take by mouth.  . pravastatin (PRAVACHOL) 40 MG tablet Take 40 mg by mouth daily.  . [DISCONTINUED] benzonatate (TESSALON) 200 MG capsule Take 1 capsule (200 mg total) by mouth 3 (three) times daily as needed for cough.  . [DISCONTINUED] ipratropium (ATROVENT) 0.06 % nasal spray Place 2 sprays into both nostrils 4 (four) times daily as needed for rhinitis.  . [DISCONTINUED] modafinil (PROVIGIL) 200 MG tablet Take 1 tablet (200 mg total) by mouth daily.   No facility-administered encounter medications on file as of 11/20/2020.    Surgical History: Past Surgical History:  Procedure Laterality Date  . CARDIAC SURGERY     stent placement    Medical History: Past Medical History:  Diagnosis Date  . Arthritis   . Depression   . Heart defect   . Hypertension   . Sleep apnea     Family History: Family History  Problem Relation Age of Onset  . Heart disease Mother   . Aneurysm Mother        back side of spine  . Other Father        fall - broken neck  . Emphysema Father   . Diabetes Brother     Social History   Socioeconomic History  . Marital status: Married    Spouse  name: Not on file  . Number of children: Not on file  . Years of education: Not on file  . Highest education level: Not on file  Occupational History  . Not on file  Tobacco Use  . Smoking status: Former Smoker    Quit date: 06/20/2010    Years since quitting: 10.4  . Smokeless tobacco: Current User    Types: Chew  Vaping Use  . Vaping Use: Some days  Substance and Sexual Activity  . Alcohol use: Yes    Comment: rarely  . Drug use: Yes    Types: Marijuana    Comment: occ.  Marland Kitchen Sexual activity: Not on file   Other Topics Concern  . Not on file  Social History Narrative  . Not on file   Social Determinants of Health   Financial Resource Strain: Not on file  Food Insecurity: Not on file  Transportation Needs: Not on file  Physical Activity: Not on file  Stress: Not on file  Social Connections: Not on file  Intimate Partner Violence: Not on file     Review of Systems  Constitutional: Negative for chills, fatigue and unexpected weight change.  HENT: Negative for congestion, postnasal drip, rhinorrhea, sneezing and sore throat.   Eyes: Negative for photophobia, redness and visual disturbance.  Respiratory: Negative for cough, chest tightness and shortness of breath.   Cardiovascular: Negative for chest pain and palpitations.  Gastrointestinal: Negative for abdominal pain, constipation, diarrhea, nausea and vomiting.       Hernia  Endocrine: Negative for polydipsia, polyphagia and polyuria.  Genitourinary: Negative for dysuria and frequency.       Abnormally shaped penis  Musculoskeletal: Negative for arthralgias, back pain, joint swelling and neck pain.  Skin: Negative for rash.  Neurological: Negative for tremors and numbness.  Hematological: Negative for adenopathy. Does not bruise/bleed easily.  Psychiatric/Behavioral: Negative for behavioral problems (Depression), sleep disturbance and suicidal ideas. The patient is not nervous/anxious.     Vital Signs: BP (!) 144/96   Pulse 87   Temp 97.7 F (36.5 C)   Resp 16   Ht 5\' 9"  (1.753 m)   Wt 234 lb 12.8 oz (106.5 kg)   SpO2 98%   BMI 34.67 kg/m    Physical Exam Vitals reviewed.  Constitutional:      Appearance: Normal appearance. He is obese.  Cardiovascular:     Rate and Rhythm: Normal rate and regular rhythm.     Pulses: Normal pulses.     Heart sounds: Normal heart sounds.  Pulmonary:     Effort: Pulmonary effort is normal.     Breath sounds: Normal breath sounds.  Abdominal:     Palpations: Abdomen is soft.      Hernia: A hernia is present.  Musculoskeletal:        General: Normal range of motion.  Skin:    General: Skin is warm.  Neurological:     General: No focal deficit present.     Mental Status: He is alert and oriented to person, place, and time. Mental status is at baseline.  Psychiatric:        Mood and Affect: Mood normal.        Behavior: Behavior normal.        Thought Content: Thought content normal.        Judgment: Judgment normal.    Assessment/Plan: 1. Encounter to establish care with new doctor Labs ordered from previous provider earlier in the year reviewed, will obtain and  review updated lipid panel - Lipid Panel With LDL/HDL Ratio  2. Umbilical hernia without obstruction and without gangrene Umbilical hernia--referral to surgery for possible surgical intervention - Ambulatory referral to General Surgery  3. Erectile dysfunction, unspecified erectile dysfunction type Reported fairly new onset of curvature in penis--causing sexual dysfunction Referral to urology for further evaluation and management - Ambulatory referral to Urology  4. Coronary artery disease involving native coronary artery of native heart without angina pectoris Remains stable at this time, followed by cardiology Will review updated lipid panel and adjust plan of care as needed//consider further vascular studies - Lipid Panel With LDL/HDL Ratio  General Counseling: Abie verbalizes understanding of the findings of todays visit and agrees with plan of treatment. I have discussed any further diagnostic evaluation that may be needed or ordered today. We also reviewed his medications today. he has been encouraged to call the office with any questions or concerns that should arise related to todays visit.  Orders Placed This Encounter  Procedures  . Lipid Panel With LDL/HDL Ratio  . Ambulatory referral to General Surgery  . Ambulatory referral to Urology    Time spent: 30 Minutes Time spent  includes review of chart, medications, test results and follow-up plan with the patient.  This patient was seen by Leeanne Deed AGNP-C in Collaboration with Dr Lyndon Code as a part of collaborative care agreement  Leeanne Deed [facility] Internal Medicine

## 2020-11-26 ENCOUNTER — Encounter: Payer: Self-pay | Admitting: Hospice and Palliative Medicine

## 2020-12-09 ENCOUNTER — Ambulatory Visit: Payer: Self-pay | Admitting: Urology

## 2020-12-11 ENCOUNTER — Ambulatory Visit: Payer: 59 | Admitting: Surgery

## 2020-12-18 ENCOUNTER — Encounter: Payer: Self-pay | Admitting: Urology

## 2020-12-18 ENCOUNTER — Encounter (INDEPENDENT_AMBULATORY_CARE_PROVIDER_SITE_OTHER): Payer: 59 | Admitting: Urology

## 2020-12-18 ENCOUNTER — Other Ambulatory Visit: Payer: Self-pay

## 2020-12-19 NOTE — Progress Notes (Signed)
Patient rescheduled and was not seen

## 2020-12-20 ENCOUNTER — Ambulatory Visit: Payer: 59 | Admitting: Hospice and Palliative Medicine

## 2021-01-02 ENCOUNTER — Other Ambulatory Visit: Payer: Self-pay

## 2021-01-02 ENCOUNTER — Ambulatory Visit (INDEPENDENT_AMBULATORY_CARE_PROVIDER_SITE_OTHER): Payer: 59 | Admitting: Urology

## 2021-01-02 ENCOUNTER — Encounter: Payer: Self-pay | Admitting: Urology

## 2021-01-02 VITALS — BP 124/87 | HR 150 | Ht 69.0 in | Wt 230.0 lb

## 2021-01-02 DIAGNOSIS — N486 Induration penis plastica: Secondary | ICD-10-CM | POA: Insufficient documentation

## 2021-01-02 MED ORDER — PENTOXIFYLLINE ER 400 MG PO TBCR: 400.0000 mg | EXTENDED_RELEASE_TABLET | Freq: Two times a day (BID) | ORAL | 2 refills | Status: DC

## 2021-01-02 NOTE — Progress Notes (Signed)
01/02/2021 9:31 AM   Sean Durham [birth year] [id number]  Referring provider: Theotis Burrow, NP [address] [place],  [place] [address]  Chief Complaint  Patient presents with  . Erectile Dysfunction    HPI: Sean Durham is a 60 y.o. male referred for erectile dysfunction.   ~1 year ago he noted a warm sensation in his groin region and with subsequent erections noted penile curvature upward into the right at the base  + Hourglass deformity  Feels penis is shortened  Curvature has been stable over the past 12 months  No pain with erections  Erections okay but has difficulty on occasions  No history of penile fracture or trauma  Does have organic risk factors for ED including hypertension, hypercholesterolemia, coronary artery disease, tobacco history and antihypertensive medications   PMH: Past Medical History:  Diagnosis Date  . Arthritis   . Depression   . Heart defect   . Hypertension   . Sleep apnea     Surgical History: Past Surgical History:  Procedure Laterality Date  . CARDIAC SURGERY     stent placement    Home Medications:  Allergies as of 01/02/2021      Reactions   Hydrochlorothiazide W-triamterene Swelling   Nicotine    Mouth ulcers   Statins Hives      Medication List       Accurate as of January 02, 2021  9:31 AM. If you have any questions, ask your nurse or doctor.        lisinopril 10 MG tablet Commonly known as: ZESTRIL Take 1 tablet by mouth daily.   metoprolol succinate 25 MG 24 hr tablet Commonly known as: TOPROL-XL Take by mouth.   pravastatin 40 MG tablet Commonly known as: PRAVACHOL Take 40 mg by mouth daily.       Allergies:  Allergies  Allergen Reactions  . Hydrochlorothiazide W-Triamterene Swelling  . Nicotine     Mouth ulcers  . Statins Hives    Family History: Family History  Problem Relation Age of Onset  . Heart disease Mother   . Aneurysm Mother        back side of spine  . Other  Father        fall - broken neck  . Emphysema Father   . Diabetes Brother     Social History:  reports that he quit smoking about 10 years ago. His smokeless tobacco use includes chew. He reports current alcohol use. He reports current drug use. Drug: Marijuana.   Physical Exam: BP 124/87   Pulse (!) 150   Ht 5\' 9"  (1.753 m)   Wt 230 lb (104.3 kg)   BMI 33.97 kg/m   Constitutional:  Alert and oriented, No acute distress. HEENT: [place] AT, moist mucus membranes.  Trachea midline, no masses. Cardiovascular: No clubbing, cyanosis, or edema. Respiratory: Normal respiratory effort, no increased work of breathing. GU: Phallus circumcised, elongated dorsal plaque, testes descended bilaterally without masses or tenderness Neurologic: Grossly intact, no focal deficits, moving all 4 extremities. Psychiatric: Normal mood and affect.   Assessment & Plan:    1. Peyronie's disease  We discussed the pathophysiology of Peyronie's disease but the cause was unknown  Treatment options were discussed including Xiaflex and various surgical procedures  He was not interested in either of these treatments at this time and was asking if there was a medication he could take  We discussed there is no approved your medication for Peyronie's disease however several medications  have been used off label  He was interested in a trial of medication and Rx pentoxifylline sent to pharmacy  For 90-day course  PA follow-up 3 months  He was also offered a referral to Dr. Guy Sandifer at [facility] to discuss surgical options   [name], MD  [facility] [address], [address] [place], [place] [address] [phone]

## 2021-01-29 ENCOUNTER — Ambulatory Visit: Payer: 59 | Admitting: Hospice and Palliative Medicine

## 2021-03-31 NOTE — Progress Notes (Signed)
04/01/2021 9:56 AM   Sean Durham [birth year] [id number]  Referring provider: Theotis Burrow, NP [address] [place],  [place] [address]  Chief Complaint  Patient presents with  . Abnormal Penile Curvature   Urological history: 1. Peyronie's disease -~1 year ago he noted a warm sensation in his groin region and with subsequent erections noted penile curvature upward into the right at the base -+ Hourglass deformity -Curvature has been stable over the past 12 months -managed with pentoxifylline   2. ED -SHIM 7 -contributing factors of hypertension, hypercholesterolemia, coronary artery disease, tobacco history, sleep apnea, polysubstance abuse and antihypertensive medications  HPI: Sean Durham is a 60 y.o. male who presents today for a three month follow up.  Patient still having spontaneous erections.  He denies any pain with erections.  His curvature is somewhat improved.   He also states his penis feels fuller with erections.    Sildenafil gave him blue-green vision.     SHIM    Row Name 04/01/21 0915         SHIM: Over the last 6 months:   How do you rate your confidence that you could get and keep an erection? Very Low     When you had erections with sexual stimulation, how often were your erections hard enough for penetration (entering your partner)? Almost Never or Never     During sexual intercourse, how often were you able to maintain your erection after you had penetrated (entered) your partner? Almost Never or Never     During sexual intercourse, how difficult was it to maintain your erection to completion of intercourse? Difficult     When you attempted sexual intercourse, how often was it satisfactory for you? Almost Never or Never           SHIM Total Score   SHIM 7            Score: 1-7 Severe ED 8-11 Moderate ED 12-16 Mild-Moderate ED 17-21 Mild ED 22-25 No ED  PMH: Past Medical History:  Diagnosis Date  . Arthritis   . Depression    . Heart defect   . Hypertension   . Sleep apnea     Surgical History: Past Surgical History:  Procedure Laterality Date  . CARDIAC SURGERY     stent placement    Home Medications:  Allergies as of 04/01/2021      Reactions   Aspirin    Other reaction(s): Other (See Comments), Unknown bruising bruising   Hydrochlorothiazide W-triamterene Swelling   Nicotine    Mouth ulcers   Statins Hives      Medication List       Accurate as of Apr 01, 2021  9:56 AM. If you have any questions, ask your nurse or doctor.        lisinopril 10 MG tablet Commonly known as: ZESTRIL Take 1 tablet by mouth daily.   metoprolol succinate 25 MG 24 hr tablet Commonly known as: TOPROL-XL Take by mouth.   pentoxifylline 400 MG CR tablet Commonly known as: TRENTAL Take 1 tablet (400 mg total) by mouth 2 (two) times daily with a meal.   pravastatin 40 MG tablet Commonly known as: PRAVACHOL Take 40 mg by mouth daily.   scopolamine 1 MG/3DAYS Commonly known as: Transderm-Scop Place 1 patch (1.5 mg total) onto the skin every 3 (three) days. Started by: Michiel Cowboy, PA-C   tadalafil 20 MG tablet Commonly known as: CIALIS Take 1 tablet (20  mg total) by mouth daily as needed for erectile dysfunction. Started by: Michiel Cowboy, PA-C       Allergies:  Allergies  Allergen Reactions  . Aspirin     Other reaction(s): Other (See Comments), Unknown bruising bruising   . Hydrochlorothiazide W-Triamterene Swelling  . Nicotine     Mouth ulcers  . Statins Hives    Family History: Family History  Problem Relation Age of Onset  . Heart disease Mother   . Aneurysm Mother        back side of spine  . Other Father        fall - broken neck  . Emphysema Father   . Diabetes Brother     Social History:  reports that he quit smoking about 10 years ago. His smokeless tobacco use includes chew. He reports current alcohol use. He reports current drug use. Drug:  Marijuana.  ROS: Pertinent ROS in HPI  Physical Exam: BP (!) 150/93   Pulse 80   Ht 5\' 9"  (1.753 m)   Wt 220 lb (99.8 kg)   BMI 32.49 kg/m   Constitutional:  Well nourished. Alert and oriented, No acute distress. HEENT: [place] AT, mask in place.  Trachea midline Cardiovascular: No clubbing, cyanosis, or edema. Respiratory: Normal respiratory effort, no increased work of breathing. Neurologic: Grossly intact, no focal deficits, moving all 4 extremities. Psychiatric: Normal mood and affect.  Laboratory Data: N/A   Pertinent Imaging: N/A  Assessment & Plan:    1. Peyronie's disease -improvement with pentoxifylline  -refill given for pentoxifylline   2. ED -sildenafil with intolerable side effects  -script given for tadalafil 20 mg, on-demand-dosing  3. Sea sickness -patient asked for scopolamine patches for his upcoming fishing trip -scopolamine patches prescription sent to pharmacy   Return in about 3 months (around 07/02/2021) for SHIM .  These notes generated with voice recognition software. I apologize for typographical errors.  09/01/2021, PA-C  [facility] [address]  [address] [place], [place] [phone]

## 2021-04-01 ENCOUNTER — Other Ambulatory Visit: Payer: Self-pay

## 2021-04-01 ENCOUNTER — Encounter: Payer: Self-pay | Admitting: Urology

## 2021-04-01 ENCOUNTER — Ambulatory Visit (INDEPENDENT_AMBULATORY_CARE_PROVIDER_SITE_OTHER): Payer: 59 | Admitting: Urology

## 2021-04-01 VITALS — BP 150/93 | HR 80 | Ht 69.0 in | Wt 220.0 lb

## 2021-04-01 DIAGNOSIS — N529 Male erectile dysfunction, unspecified: Secondary | ICD-10-CM | POA: Diagnosis not present

## 2021-04-01 DIAGNOSIS — N486 Induration penis plastica: Secondary | ICD-10-CM | POA: Diagnosis not present

## 2021-04-01 DIAGNOSIS — T753XXA Motion sickness, initial encounter: Secondary | ICD-10-CM

## 2021-04-01 MED ORDER — SCOPOLAMINE 1 MG/3DAYS TD PT72: 1.0000 | MEDICATED_PATCH | TRANSDERMAL | 0 refills | Status: DC

## 2021-04-01 MED ORDER — PENTOXIFYLLINE ER 400 MG PO TBCR: 400.0000 mg | EXTENDED_RELEASE_TABLET | Freq: Two times a day (BID) | ORAL | 2 refills | Status: DC

## 2021-04-01 MED ORDER — TADALAFIL 20 MG PO TABS: 20.0000 mg | ORAL_TABLET | Freq: Every day | ORAL | 3 refills | Status: DC | PRN

## 2021-07-03 NOTE — Progress Notes (Deleted)
07/04/2021 10:48 AM   [name] [birth year] [id number]  Referring provider: Theotis Burrow, NP No address on file  Urological history: 1. Peyronie's disease -~1 year ago he noted a warm sensation in his groin region and with subsequent erections noted penile curvature upward into the right at the base -+ Hourglass deformity -Curvature has been stable over the past 12 months -managed with pentoxifylline   2. ED -SHIM 7 -contributing factors of hypertension, hypercholesterolemia, coronary artery disease, tobacco history, sleep apnea, polysubstance abuse and antihypertensive medications  No chief complaint on file.   HPI: VEER ELAMIN is a 60 y.o. male who presents today for a three month follow up.  Patient still having spontaneous erections.  He denies any pain with erections.  His curvature is somewhat improved.   He also states his penis feels fuller with erections.    Sildenafil gave him blue-green vision.       Score: 1-7 Severe ED 8-11 Moderate ED 12-16 Mild-Moderate ED 17-21 Mild ED 22-25 No ED  PMH: Past Medical History:  Diagnosis Date   Arthritis    Depression    Heart defect    Hypertension    Sleep apnea     Surgical History: Past Surgical History:  Procedure Laterality Date   CARDIAC SURGERY     stent placement    Home Medications:  Allergies as of 07/04/2021       Reactions   Aspirin    Other reaction(s): Other (See Comments), Unknown bruising bruising   Hydrochlorothiazide W-triamterene Swelling   Nicotine    Mouth ulcers   Statins Hives        Medication List        Accurate as of July 03, 2021 10:48 AM. If you have any questions, ask your nurse or doctor.          lisinopril 10 MG tablet Commonly known as: ZESTRIL Take 1 tablet by mouth daily.   metoprolol succinate 25 MG 24 hr tablet Commonly known as: TOPROL-XL Take by mouth.   pentoxifylline 400 MG CR tablet Commonly known as: TRENTAL Take 1  tablet (400 mg total) by mouth 2 (two) times daily with a meal.   pravastatin 40 MG tablet Commonly known as: PRAVACHOL Take 40 mg by mouth daily.   scopolamine 1 MG/3DAYS Commonly known as: Transderm-Scop Place 1 patch (1.5 mg total) onto the skin every 3 (three) days.   tadalafil 20 MG tablet Commonly known as: CIALIS Take 1 tablet (20 mg total) by mouth daily as needed for erectile dysfunction.        Allergies:  Allergies  Allergen Reactions   Aspirin     Other reaction(s): Other (See Comments), Unknown bruising bruising    Hydrochlorothiazide W-Triamterene Swelling   Nicotine     Mouth ulcers   Statins Hives    Family History: Family History  Problem Relation Age of Onset   Heart disease Mother    Aneurysm Mother        back side of spine   Other Father        fall - broken neck   Emphysema Father    Diabetes Brother     Social History:  reports that he has quit smoking. His smokeless tobacco use includes chew. He reports current alcohol use. He reports current drug use. Drug: Marijuana.  ROS: Pertinent ROS in HPI  Physical Exam: There were no vitals taken for this visit.  Constitutional:  Well nourished. Alert  and oriented, No acute distress. HEENT: [place] AT, moist mucus membranes.  Trachea midline Cardiovascular: No clubbing, cyanosis, or edema. Respiratory: Normal respiratory effort, no increased work of breathing. GI: Abdomen is soft, non tender, non distended, no abdominal masses. Liver and spleen not palpable.  No hernias appreciated.  Stool sample for occult testing is not indicated.   GU: No CVA tenderness.  No bladder fullness or masses.  Patient with circumcised/uncircumcised phallus. ***Foreskin easily retracted***  Urethral meatus is patent.  No penile discharge. No penile lesions or rashes. Scrotum without lesions, cysts, rashes and/or edema.  Testicles are located scrotally bilaterally. No masses are appreciated in the testicles. Left and right  epididymis are normal. Rectal: Patient with  normal sphincter tone. Anus and perineum without scarring or rashes. No rectal masses are appreciated. Prostate is approximately *** grams, *** nodules are appreciated. Seminal vesicles are normal. Skin: No rashes, bruises or suspicious lesions. Lymph: No inguinal adenopathy. Neurologic: Grossly intact, no focal deficits, moving all 4 extremities. Psychiatric: Normal mood and affect.   Laboratory Data: N/A   Pertinent Imaging: N/A  Assessment & Plan:    1. Peyronie's disease -improvement with pentoxifylline  -refill given for pentoxifylline   2. ED -sildenafil with intolerable side effects  -script given for tadalafil 20 mg, on-demand-dosing  3. Sea sickness -patient asked for scopolamine patches for his upcoming fishing trip -scopolamine patches prescription sent to pharmacy   No follow-ups on file.  These notes generated with voice recognition software. I apologize for typographical errors.  Michiel Cowboy, PA-C  [facility] [address]  [address] [place], [place] [address] [phone]

## 2021-07-04 ENCOUNTER — Ambulatory Visit: Payer: Self-pay | Admitting: Urology

## 2021-07-04 DIAGNOSIS — N486 Induration penis plastica: Secondary | ICD-10-CM

## 2021-07-04 DIAGNOSIS — N529 Male erectile dysfunction, unspecified: Secondary | ICD-10-CM

## 2021-12-24 ENCOUNTER — Inpatient Hospital Stay
Admission: EM | Admit: 2021-12-24 | Discharge: 2021-12-29 | DRG: 871 | Disposition: A | Discharge status: Home / Self Care | Payer: Worker's Compensation | Attending: Internal Medicine | Admitting: Internal Medicine

## 2021-12-24 ENCOUNTER — Other Ambulatory Visit: Payer: Self-pay

## 2021-12-24 ENCOUNTER — Emergency Department: Payer: Worker's Compensation

## 2021-12-24 DIAGNOSIS — Z825 Family history of asthma and other chronic lower respiratory diseases: Secondary | ICD-10-CM

## 2021-12-24 DIAGNOSIS — Z79899 Other long term (current) drug therapy: Secondary | ICD-10-CM

## 2021-12-24 DIAGNOSIS — Z8781 Personal history of (healed) traumatic fracture: Secondary | ICD-10-CM

## 2021-12-24 DIAGNOSIS — R0600 Dyspnea, unspecified: Secondary | ICD-10-CM

## 2021-12-24 DIAGNOSIS — R6510 Systemic inflammatory response syndrome (SIRS) of non-infectious origin without acute organ dysfunction: Secondary | ICD-10-CM

## 2021-12-24 DIAGNOSIS — E782 Mixed hyperlipidemia: Secondary | ICD-10-CM

## 2021-12-24 DIAGNOSIS — J90 Pleural effusion, not elsewhere classified: Secondary | ICD-10-CM | POA: Diagnosis present

## 2021-12-24 DIAGNOSIS — Z9861 Coronary angioplasty status: Secondary | ICD-10-CM

## 2021-12-24 DIAGNOSIS — D72829 Elevated white blood cell count, unspecified: Secondary | ICD-10-CM | POA: Diagnosis present

## 2021-12-24 DIAGNOSIS — D75839 Thrombocytosis, unspecified: Secondary | ICD-10-CM | POA: Diagnosis present

## 2021-12-24 DIAGNOSIS — I2510 Atherosclerotic heart disease of native coronary artery without angina pectoris: Secondary | ICD-10-CM

## 2021-12-24 DIAGNOSIS — F17220 Nicotine dependence, chewing tobacco, uncomplicated: Secondary | ICD-10-CM | POA: Diagnosis present

## 2021-12-24 DIAGNOSIS — Z8616 Personal history of COVID-19: Secondary | ICD-10-CM

## 2021-12-24 DIAGNOSIS — Z28310 Unvaccinated for covid-19: Secondary | ICD-10-CM

## 2021-12-24 DIAGNOSIS — G4733 Obstructive sleep apnea (adult) (pediatric): Secondary | ICD-10-CM

## 2021-12-24 DIAGNOSIS — Z833 Family history of diabetes mellitus: Secondary | ICD-10-CM

## 2021-12-24 DIAGNOSIS — Z955 Presence of coronary angioplasty implant and graft: Secondary | ICD-10-CM

## 2021-12-24 DIAGNOSIS — E669 Obesity, unspecified: Secondary | ICD-10-CM | POA: Diagnosis present

## 2021-12-24 DIAGNOSIS — Z6835 Body mass index (BMI) 35.0-35.9, adult: Secondary | ICD-10-CM

## 2021-12-24 DIAGNOSIS — A419 Sepsis, unspecified organism: Principal | ICD-10-CM | POA: Diagnosis present

## 2021-12-24 DIAGNOSIS — J189 Pneumonia, unspecified organism: Secondary | ICD-10-CM | POA: Diagnosis present

## 2021-12-24 DIAGNOSIS — R911 Solitary pulmonary nodule: Secondary | ICD-10-CM | POA: Diagnosis present

## 2021-12-24 DIAGNOSIS — I10 Essential (primary) hypertension: Secondary | ICD-10-CM | POA: Diagnosis present

## 2021-12-24 DIAGNOSIS — Z8249 Family history of ischemic heart disease and other diseases of the circulatory system: Secondary | ICD-10-CM

## 2021-12-24 DIAGNOSIS — R0789 Other chest pain: Secondary | ICD-10-CM

## 2021-12-24 LAB — CBC WITH DIFFERENTIAL/PLATELET
Abs Immature Granulocytes: 0.05 10*3/uL (ref 0.00–0.07)
Basophils Absolute: 0.1 10*3/uL (ref 0.0–0.1)
Basophils Relative: 0 %
Eosinophils Absolute: 0.1 10*3/uL (ref 0.0–0.5)
Eosinophils Relative: 1 %
HCT: 46.2 % (ref 39.0–52.0)
Hemoglobin: 14.8 g/dL (ref 13.0–17.0)
Immature Granulocytes: 0 %
Lymphocytes Relative: 4 %
Lymphs Abs: 0.5 10*3/uL — ABNORMAL LOW (ref 0.7–4.0)
MCH: 27.6 pg (ref 26.0–34.0)
MCHC: 32 g/dL (ref 30.0–36.0)
MCV: 86.2 fL (ref 80.0–100.0)
Monocytes Absolute: 0.7 10*3/uL (ref 0.1–1.0)
Monocytes Relative: 5 %
Neutro Abs: 11.7 10*3/uL — ABNORMAL HIGH (ref 1.7–7.7)
Neutrophils Relative %: 90 %
Platelets: 414 10*3/uL — ABNORMAL HIGH (ref 150–400)
RBC: 5.36 MIL/uL (ref 4.22–5.81)
RDW: 13.1 % (ref 11.5–15.5)
WBC: 13 10*3/uL — ABNORMAL HIGH (ref 4.0–10.5)
nRBC: 0 % (ref 0.0–0.2)

## 2021-12-24 LAB — LACTIC ACID, PLASMA
Lactic Acid, Venous: 1.7 mmol/L (ref 0.5–1.9)
Lactic Acid, Venous: 1.1 mmol/L (ref 0.5–1.9)

## 2021-12-24 LAB — BASIC METABOLIC PANEL
Anion gap: 10 (ref 5–15)
BUN: 13 mg/dL (ref 8–23)
CO2: 29 mmol/L (ref 22–32)
Calcium: 9.4 mg/dL (ref 8.9–10.3)
Chloride: 100 mmol/L (ref 98–111)
Creatinine, Ser: 0.83 mg/dL (ref 0.61–1.24)
GFR, Estimated: >60 mL/min (ref >60)
Glucose, Bld: 119 mg/dL — ABNORMAL HIGH (ref 70–99)
Potassium: 4.1 mmol/L (ref 3.5–5.1)
Sodium: 139 mmol/L (ref 135–145)

## 2021-12-24 LAB — RESP PANEL BY RT-PCR (FLU A&B, COVID) ARPGX2
Influenza A by PCR: NEGATIVE
Influenza B by PCR: NEGATIVE
SARS Coronavirus 2 by RT PCR: NEGATIVE

## 2021-12-24 LAB — BRAIN NATRIURETIC PEPTIDE: B Natriuretic Peptide: 9.8 pg/mL (ref 0.0–100.0)

## 2021-12-24 LAB — PROTIME-INR
INR: 1 (ref 0.8–1.2)
Prothrombin Time: 12.9 seconds (ref 11.4–15.2)

## 2021-12-24 LAB — TROPONIN I (HIGH SENSITIVITY): Troponin I (High Sensitivity): 9 ng/L (ref <18)

## 2021-12-24 LAB — APTT: aPTT: 30 seconds (ref 24–36)

## 2021-12-24 LAB — PHOSPHORUS: Phosphorus: 3.1 mg/dL (ref 2.5–4.6)

## 2021-12-24 LAB — MAGNESIUM: Magnesium: 1.9 mg/dL (ref 1.7–2.4)

## 2021-12-24 LAB — PROCALCITONIN: Procalcitonin: <0.1 ng/mL

## 2021-12-24 MED ORDER — OXYCODONE-ACETAMINOPHEN 7.5-325 MG PO TABS
1.0000 | ORAL_TABLET | Freq: Once | ORAL | Status: AC
  Administered 2021-12-24: 16:00:00 1 via ORAL
  Filled 2021-12-24: qty 1

## 2021-12-24 MED ORDER — IOHEXOL 300 MG/ML  SOLN
75.0000 mL | Freq: Once | INTRAMUSCULAR | Status: AC | PRN
  Administered 2021-12-24: 17:00:00 75 mL via INTRAVENOUS
  Filled 2021-12-24: qty 75

## 2021-12-24 MED ORDER — SODIUM CHLORIDE 0.9 % IV SOLN: 1.0000 g | Freq: Once | INTRAVENOUS | Status: DC

## 2021-12-24 MED ORDER — MORPHINE SULFATE (PF) 2 MG/ML IV SOLN: 2.0000 mg | INTRAVENOUS | Status: DC | PRN

## 2021-12-24 MED ORDER — ONDANSETRON HCL 4 MG PO TABS: 4.0000 mg | ORAL_TABLET | Freq: Four times a day (QID) | ORAL | Status: DC | PRN

## 2021-12-24 MED ORDER — LISINOPRIL 10 MG PO TABS
10.0000 mg | ORAL_TABLET | Freq: Every day | ORAL | Status: DC
  Administered 2021-12-24 – 2021-12-28 (×5): 10 mg via ORAL
  Filled 2021-12-24 (×5): qty 1

## 2021-12-24 MED ORDER — AZITHROMYCIN 500 MG IV SOLR
500.0000 mg | Freq: Once | INTRAVENOUS | Status: AC
  Administered 2021-12-24: 21:00:00 500 mg via INTRAVENOUS
  Filled 2021-12-24: qty 5

## 2021-12-24 MED ORDER — ENOXAPARIN SODIUM 60 MG/0.6ML IJ SOSY: 0.5000 mg/kg | PREFILLED_SYRINGE | INTRAMUSCULAR | Status: DC

## 2021-12-24 MED ORDER — MORPHINE SULFATE (PF) 4 MG/ML IV SOLN
4.0000 mg | Freq: Once | INTRAVENOUS | Status: AC
  Administered 2021-12-24: 14:00:00 4 mg via INTRAVENOUS
  Filled 2021-12-24: qty 1

## 2021-12-24 MED ORDER — SODIUM CHLORIDE 0.9 % IV SOLN
500.0000 mg | INTRAVENOUS | Status: DC
  Administered 2021-12-25 – 2021-12-26 (×2): 500 mg via INTRAVENOUS
  Filled 2021-12-24 (×2): qty 500
  Filled 2021-12-24: qty 5

## 2021-12-24 MED ORDER — SCOPOLAMINE 1 MG/3DAYS TD PT72: 1.0000 | MEDICATED_PATCH | TRANSDERMAL | Status: DC

## 2021-12-24 MED ORDER — ENOXAPARIN SODIUM 60 MG/0.6ML IJ SOSY
0.5000 mg/kg | PREFILLED_SYRINGE | INTRAMUSCULAR | Status: DC
  Administered 2021-12-25 – 2021-12-28 (×4): 50 mg via SUBCUTANEOUS
  Filled 2021-12-24 (×5): qty 0.6

## 2021-12-24 MED ORDER — ORPHENADRINE CITRATE 30 MG/ML IJ SOLN
60.0000 mg | Freq: Two times a day (BID) | INTRAMUSCULAR | Status: DC
  Administered 2021-12-24 – 2021-12-25 (×2): 60 mg via INTRAMUSCULAR
  Filled 2021-12-24 (×4): qty 2

## 2021-12-24 MED ORDER — KETOROLAC TROMETHAMINE 15 MG/ML IJ SOLN
15.0000 mg | Freq: Once | INTRAMUSCULAR | Status: DC
  Filled 2021-12-24: qty 1

## 2021-12-24 MED ORDER — SODIUM CHLORIDE 0.9 % IV SOLN: 1.0000 g | INTRAVENOUS | Status: DC

## 2021-12-24 MED ORDER — SODIUM CHLORIDE 0.9 % IV SOLN
2.0000 g | INTRAVENOUS | Status: DC
  Administered 2021-12-25 – 2021-12-27 (×3): 2 g via INTRAVENOUS
  Filled 2021-12-24: qty 20
  Filled 2021-12-24: qty 2
  Filled 2021-12-24: qty 20
  Filled 2021-12-24: qty 2

## 2021-12-24 MED ORDER — VITAMIN B-12 1000 MCG PO TABS
1000.0000 ug | ORAL_TABLET | Freq: Every day | ORAL | Status: DC
  Administered 2021-12-25 – 2021-12-29 (×5): 1000 ug via ORAL
  Filled 2021-12-24 (×6): qty 1

## 2021-12-24 MED ORDER — PRAVASTATIN SODIUM 20 MG PO TABS
40.0000 mg | ORAL_TABLET | Freq: Every day | ORAL | Status: DC
  Filled 2021-12-24 (×3): qty 2

## 2021-12-24 MED ORDER — ACETAMINOPHEN 325 MG PO TABS: 650.0000 mg | ORAL_TABLET | Freq: Four times a day (QID) | ORAL | Status: AC | PRN

## 2021-12-24 MED ORDER — HYDRALAZINE HCL 10 MG PO TABS
10.0000 mg | ORAL_TABLET | Freq: Four times a day (QID) | ORAL | Status: DC | PRN
  Filled 2021-12-24: qty 1

## 2021-12-24 MED ORDER — ONDANSETRON HCL 4 MG/2ML IJ SOLN: 4.0000 mg | Freq: Four times a day (QID) | INTRAMUSCULAR | Status: DC | PRN

## 2021-12-24 MED ORDER — ACETAMINOPHEN 650 MG RE SUPP: 650.0000 mg | Freq: Four times a day (QID) | RECTAL | Status: AC | PRN

## 2021-12-24 MED ORDER — SODIUM CHLORIDE 0.9 % IV SOLN
2.0000 g | Freq: Once | INTRAVENOUS | Status: AC
  Administered 2021-12-24: 22:00:00 2 g via INTRAVENOUS
  Filled 2021-12-24: qty 20

## 2021-12-24 MED ORDER — KETOROLAC TROMETHAMINE 15 MG/ML IJ SOLN
15.0000 mg | Freq: Four times a day (QID) | INTRAMUSCULAR | Status: AC | PRN
  Administered 2021-12-24 – 2021-12-25 (×3): 15 mg via INTRAVENOUS
  Filled 2021-12-24 (×4): qty 1

## 2021-12-24 MED ORDER — SODIUM CHLORIDE 0.9 % IV SOLN: INTRAVENOUS | Status: DC | PRN

## 2021-12-24 NOTE — Assessment & Plan Note (Signed)
-  Patient met sepsis criteria with increased respiration rate, heart rate, leukocytosis, and possible source of pneumonia - Procalcitonin is pending - Status post ceftriaxone 1 g IV and azithromycin 500 mg IV per EDP - Ordered additional ceftriaxone 1 g for day of admission to total ceftriaxone 2 g IV - Ordered azithromycin 500 mg and ceftriaxone 2 g IV starting on 12/25/2021, additional 4-day course - Patient is maintaining appropriate MAP of greater than 65, no indications for fluid supplementation and/or bolus at this time

## 2021-12-24 NOTE — Assessment & Plan Note (Addendum)
-   Etiology work-up in progress - EDP ordered thoracentesis of the right lobe with specimen collection - Check high-sensitivity troponin

## 2021-12-24 NOTE — ED Notes (Signed)
vaugh,lisa Spouse     [phone]   Wants to be contacted if something happens or when procedure starts if before she can return will be back in am.

## 2021-12-24 NOTE — Progress Notes (Signed)
PHARMACIST - PHYSICIAN COMMUNICATION  CONCERNING:  Enoxaparin (Lovenox) for DVT Prophylaxis    RECOMMENDATION: Patient was prescribed enoxaparin 40mg  q24 hours for VTE prophylaxis.   Filed Weights   12/24/21 1343  Weight: 99.8 kg (220 lb)    Body mass index is 32.49 kg/m.  Estimated Creatinine Clearance: 107.4 mL/min (by C-G formula based on SCr of 0.83 mg/dL).   Based on [place] patient is candidate for enoxaparin 0.5mg /kg TBW SQ every 24 hours based on BMI being >30.  DESCRIPTION: Pharmacy has adjusted enoxaparin dose per [facility] policy.  Patient is now receiving enoxaparin 50 mg every 24 hours   Sean Durham 12/24/2021 6:25 PM

## 2021-12-24 NOTE — ED Provider Notes (Signed)
[facility] Provider Note    Event Date/Time   First MD Initiated Contact with Patient 12/24/21 1353     (approximate)   History   Chest Injury   HPI  Sean Durham is a 61 y.o. male   presents to the ED with complaint of right-sided chest wall pain.  Patient states he was pulling on a wrench at work and felt like something popped in his ribs and currently he is also experiencing some pain radiating up to the right side of his neck.  He reports that the wrench did not hit him and there was not any direct trauma.  Patient reports that it hurts to take deep breaths.  Patient has a history of hypertension, coronary artery disease, mixed hyperlipidemia, and sleep apnea.  Patient was also a former smoker but discontinued and only chews tobacco at this time.  Currently rates his pain as a 10/10.      Physical Exam   Triage Vital Signs: ED Triage Vitals  Enc Vitals Group     BP 12/24/21 1347 (!) 157/98     Pulse Rate 12/24/21 1347 (!) 113     Resp 12/24/21 1347 (!) 24     Temp 12/24/21 1347 98.7 F (37.1 C)     Temp Source 12/24/21 1347 Oral     SpO2 12/24/21 1347 96 %     Weight 12/24/21 1343 220 lb (99.8 kg)     Height 12/24/21 1343 5\' 9"  (1.753 m)     Head Circumference --      Peak Flow --      Pain Score 12/24/21 1343 10     Pain Loc --      Pain Edu? --      Excl. in GC? --     Most recent vital signs: Vitals:   12/24/21 1347  BP: (!) 157/98  Pulse: (!) 113  Resp: (!) 24  Temp: 98.7 F (37.1 C)  SpO2: 96%     General: Awake, groaning frequently.  Obviously uncomfortable.  Patient holding his right ribs and guarding. CV:  Good peripheral perfusion.  Resp:  Normal effort.  Lungs are clear bilaterally. Abd:  No distention.  Soft, nontender. Other:  Moderate tenderness on palpation of the right seventh eighth and ninth ribs both anteriorly and laterally.   ED Results / Procedures / Treatments   Labs (all labs ordered are listed,  but only abnormal results are displayed) Labs Reviewed  CBC WITH DIFFERENTIAL/PLATELET  BASIC METABOLIC PANEL     EKG Sinus tachycardia Vent. rate 113 BPM PR interval 136 ms QRS duration 78 ms QT/QTcB 310/425 ms P-R-T axes 56 9 40    RADIOLOGY Chest x-ray images were reviewed by myself no obvious fracture noted.  Radiology report reviewed with mild right bibasilar atelectasis or infiltrate with small pleural effusion.    PROCEDURES:  Critical Care performed:   Procedures   MEDICATIONS ORDERED IN ED: Medications  orphenadrine (NORFLEX) injection 60 mg (60 mg Intramuscular Given 12/24/21 1550)  ketorolac (TORADOL) 15 MG/ML injection 15 mg (0 mg Intravenous Hold 12/24/21 1612)  morphine 4 MG/ML injection 4 mg (4 mg Intravenous Given 12/24/21 1413)  oxyCODONE-acetaminophen (PERCOCET) 7.5-325 MG per tablet 1 tablet (1 tablet Oral Given 12/24/21 1550)     IMPRESSION / MDM / ASSESSMENT AND PLAN / ED COURSE  I reviewed the triage vital signs and the nursing notes.   Differential diagnosis includes, but is not limited to, rib fracture,  chest wall strain, cardiac contusion, lung contusion, per chest x-ray possible infiltrate right bibasilar area.   ----------------------------------------- 3:27 PM on 12/24/2021 ----------------------------------------- Patient states that the pain continues and that the pain medication he was given IV only made him sleepy.  We will try Norflex 60 mg IM and Percocet 7.5 p.o.  ----------------------------------------- 4:12 PM on 12/24/2021 ----------------------------------------- CT chest with contrast has been ordered and lab work pending.  I spoke with Dr. Antoine Primas who will be taking over the care and management of this patient at this time.  Patient was also given Percocet and Norflex 60 mg IM for pain relief and also to reduce muscle spasms.  Patient appears to be more comfortable after returning back from x-ray than he was initial  evaluation.    FINAL CLINICAL IMPRESSION(S) / ED DIAGNOSES   Final diagnoses:  Acute chest wall pain     Rx / DC Orders   ED Discharge Orders     None        Note:  This document was prepared using Dragon voice recognition software and may include unintentional dictation errors.   Tommi Rumps, PA-C 12/24/21 1615    Gilles Chiquito, MD 12/24/21 [place] Guardian

## 2021-12-24 NOTE — ED Triage Notes (Signed)
Pt states he was pulling on a wrench at work and felt like something popped, pt c/o right later rib and RUQ pain and pain to the right anterior neck, states it hurts to breath, lung sounds in all fields, clear . Pt is moaning and grimacing hold his side on arrival

## 2021-12-24 NOTE — Assessment & Plan Note (Signed)
-   Approximately 10 years ago

## 2021-12-24 NOTE — Assessment & Plan Note (Addendum)
-   Presumed secondary to pneumonia - Treat as above

## 2021-12-24 NOTE — ED Notes (Signed)
Patient transported to X-ray 

## 2021-12-24 NOTE — ED Notes (Signed)
Lab notified of blood culture orders and other labs.

## 2021-12-24 NOTE — Assessment & Plan Note (Signed)
-   Pravastatin 40 mg nightly has been resumed

## 2021-12-24 NOTE — Assessment & Plan Note (Signed)
-   Patient takes lisinopril 10 mg daily, metoprolol succinate 25 mg daily - Hydralazine 10 mg p.o. every 6 hours as needed for SBP greater than 180 ordered

## 2021-12-24 NOTE — Hospital Course (Signed)
Mr. Sean Durham is a 61 year old male with history of peyronie's disease, primary hypertension, mixed hyperlipidemia, who presents to the emergency department for chief concerns of cough and pain on the right side of his chest.  Initial vitals in the emergency department showed temperature of 98.7, respiration rate 24, heart rate of 113, initial blood pressure 157/98, SPO2 of 96% on room air.  Serum sodium in the emergency department was 139, potassium 4.1, chloride 100, bicarb 29, BUN 13, serum creatinine of 0.83, nonfasting blood glucose 119, GFR greater than 60.  WBC was elevated at 13, hemoglobin 14.8, platelets of 414.    COVID/influenza A/influenza B PCR were pending.  BNP was within normal limits.  2 view chest x-ray was read as mild right basilar atelectasis or infiltrates with small right pleural effusion.  CT of the chest with contrast was read as moderate size right pleural effusion with adjacent compressive atelectasis in the right lower lobe.  Pleural fluid measures simple density.  Rounded subpleural density spanning the fissure in the anterior right lung base measuring 2.6 x 2.4 cm, not seen on prior exam.  Extending to the pleural surface with mild adjacent fissural and pleural thickening.  Recommend close CT follow-up after a course of treatment to ensure resolution.  In the emergency department patient was given 1 dose of oxycodone-acetaminophen 7.5-325 mg tablet, morphine 4 mg IV once, ceftriaxone 1 g IV, azithromycin 500 mg IV.

## 2021-12-24 NOTE — Assessment & Plan Note (Signed)
-   Currently does not wear CPAP due to inability to tolerate

## 2021-12-24 NOTE — H&P (Addendum)
History and Physical   Sean Durham UOH:[id number] DOB: 12/12/1960 DOA: 12/24/2021  PCP: Jonetta Osgood, NP  Outpatient Specialists: Dr. Nehemiah Massed, [facility] Patient coming from: Work  I have personally briefly reviewed patient's old medical records in [place].  Chief Concern: Right-sided chest pain, blunt trauma at work, shortness of breath  HPI: Mr. Sean Durham is a 61 year old male with history of peyronie's disease, primary hypertension, mixed hyperlipidemia, who presents to the emergency department for chief concerns of cough and pain on the right side of his chest.  Initial vitals in the emergency department showed temperature of 98.7, respiration rate 24, heart rate of 113, initial blood pressure 157/98, SPO2 of 96% on room air.  Serum sodium in the emergency department was 139, potassium 4.1, chloride 100, bicarb 29, BUN 13, serum creatinine of 0.83, nonfasting blood glucose 119, GFR greater than 60.  WBC was elevated at 13, hemoglobin 14.8, platelets of 414.    COVID/influenza A/influenza B PCR were pending.  BNP was within normal limits.  2 view chest x-ray was read as mild right basilar atelectasis or infiltrates with small right pleural effusion.  CT of the chest with contrast was read as moderate size right pleural effusion with adjacent compressive atelectasis in the right lower lobe.  Pleural fluid measures simple density.  Rounded subpleural density spanning the fissure in the anterior right lung base measuring 2.6 x 2.4 cm, not seen on prior exam.  Extending to the pleural surface with mild adjacent fissural and pleural thickening.  Recommend close CT follow-up after a course of treatment to ensure resolution.  In the emergency department patient was given 1 dose of oxycodone-acetaminophen 7.5-325 mg tablet, morphine 4 mg IV once, ceftriaxone 1 g IV, azithromycin 500 mg IV.  At bedside, he is able to tell me his name, age, current calendar year. He was having pain  from the right lateral ribs to the right shoulder. He had difficulty taking deep breaths. He could only take shallow breaths. He endorses subjective fever, Sunday night-Monday morning. He did not take his temperature. He felt diaphoretic, soaking his sheets. He keeps his thermosat at 91 and he works outside as a Building surveyor. He was driving from [employer] to [place], this was approximately lunch time. He was working on an Building surveyor, tightening some bolts when he felt a flop on his right sided lateral ribs.   He reports he is never really felt this way before. However, something similar happened about 10 years ago when he had his heart attack. He states that at that time his left side became numb and weak. He states that he had just quit tobacco and EtOH use.  He attributes his heart attack to the thinning of his blood causing him to have a heart attack.  He denies recent skin lesions/infections, lacerations, cough, known infections. He denies direct trauma to the area of concern. He denies known weight changes, gain or loss.   Social history: He lives with his wife. He is a former tobacco user, quitting 10 years ago. At his peak, he smoked 1 ppd. He quit drinking 10 years ago as well.   ROS: Constitutional: no weight change, no fever ENT/Mouth: no sore throat, no rhinorrhea Eyes: no eye pain, no vision changes Cardiovascular: no chest pain, + dyspnea,  no edema, no palpitations Respiratory: no cough, no sputum, no wheezing Gastrointestinal: no nausea, no vomiting, no diarrhea, + constipation Genitourinary: no urinary incontinence, no dysuria, no hematuria Musculoskeletal: no arthralgias, no  myalgias Skin: no skin lesions, no pruritus, Neuro: + weakness, no loss of consciousness, no syncope Psych: no anxiety, no depression, + decrease appetite Heme/Lymph: no bruising, no bleeding  ED Course: Discussed with emergency medicine provider, patient requiring hospitalization for chief concerns of  moderate right pleural effusion.  Assessment/Plan  Principal Problem:   Pleural effusion on right Active Problems:   Primary hypertension   Hyperlipidemia, mixed   Sepsis (HCC)   Leukocytosis   OSA (obstructive sleep apnea)   CAD S/P percutaneous coronary angioplasty   History of COVID-19    Cardiovascular and Mediastinum CAD S/P percutaneous coronary angioplasty Assessment & Plan - Approximately 10 years ago  Primary hypertension Assessment & Plan - Patient takes lisinopril 10 mg daily, metoprolol succinate 25 mg daily - Hydralazine 10 mg p.o. every 6 hours as needed for SBP greater than 180 ordered  Respiratory OSA (obstructive sleep apnea) Assessment & Plan - Currently does not wear CPAP due to inability to tolerate  * Pleural effusion on right Assessment & Plan - Etiology work-up in progress - EDP ordered thoracentesis of the right lobe with specimen collection - Check high-sensitivity troponin   Other Leukocytosis Assessment & Plan - Presumed secondary to pneumonia - Treat as above  Sepsis ([place]) Assessment & Plan - Patient met sepsis criteria with increased respiration rate, heart rate, leukocytosis, and possible source of pneumonia - Procalcitonin is pending - Status post ceftriaxone 1 g IV and azithromycin 500 mg IV per EDP - Ordered additional ceftriaxone 1 g for day of admission to total ceftriaxone 2 g IV - Ordered azithromycin 500 mg and ceftriaxone 2 g IV starting on 12/25/2021, additional 4-day course - Patient is maintaining appropriate MAP of greater than 65, no indications for fluid supplementation and/or bolus at this time  Hyperlipidemia, mixed Assessment & Plan - Pravastatin 40 mg nightly has been resumed  # He had history of testing positive for covid in 2020 - He is not vaccinated for covid 19   DVT prophylaxis-TED hose - I have deferred pharmacologic DVT prophylaxis for 12/25/2021 in anticipation of thoracentesis  Chart reviewed.    DVT prophylaxis: Enoxaparin starting on 12/25/2021 in the evening due to anticipation of thoracentesis in the a.m. Code Status: full code  Diet: Heart healthy Family Communication: Patient declined stating that he will call and update his spouse Disposition Plan: Pending clinical course Consults called: None at this time Admission status: Telemetry medical, observation  Past Medical History:  Diagnosis Date   Arthritis    Depression    Heart defect    Hypertension    Sleep apnea    Past Surgical History:  Procedure Laterality Date   CARDIAC SURGERY     stent placement   Social History:  reports that he has quit smoking. His smokeless tobacco use includes chew. He reports current alcohol use. He reports current drug use. Drug: Marijuana.  Allergies  Allergen Reactions   Aspirin     Other reaction(s): Other (See Comments), Unknown bruising bruising    Hydrochlorothiazide W-Triamterene Swelling   Nicotine     Mouth ulcers   Statins Hives   Family History  Problem Relation Age of Onset   Heart disease Mother    Aneurysm Mother        back side of spine   Other Father        fall - broken neck   Emphysema Father    Diabetes Brother    Family history: Family history reviewed and not pertinent.  Prior to Admission medications   Medication Sig Start Date End Date Taking? Authorizing Provider  lisinopril (PRINIVIL,ZESTRIL) 10 MG tablet Take 1 tablet by mouth daily. 08/16/17 12/24/21 Yes [provider]  scopolamine (TRANSDERM-SCOP) 1 MG/3DAYS Place 1 patch (1.5 mg total) onto the skin every 3 (three) days. 04/01/21  Yes McGowan, Larene Beach A, PA-C  Ascorbic Acid 500 MG CHEW Chew 1 tablet by mouth daily.    [provider]  Cholecalciferol 50 MCG (2000 UT) TABS Take 1 tablet by mouth daily.    [provider]  cyanocobalamin 1000 MCG tablet Take 1,000 mcg by mouth daily.    [provider]  Magnesium Gluconate 550 MG TABS Take 1 tablet by  mouth daily.    [provider]  metoprolol succinate (TOPROL-XL) 25 MG 24 hr tablet Take by mouth. Patient not taking: Reported on 12/24/2021 06/06/19   [provider]  pentoxifylline (TRENTAL) 400 MG CR tablet Take 1 tablet (400 mg total) by mouth 2 (two) times daily with a meal. Patient not taking: Reported on 12/24/2021 04/01/21   Zara Council A, PA-C  pravastatin (PRAVACHOL) 40 MG tablet Take 40 mg by mouth daily. Patient not taking: Reported on 12/24/2021    [provider]  tadalafil (CIALIS) 20 MG tablet Take 1 tablet (20 mg total) by mouth daily as needed for erectile dysfunction. Patient not taking: Reported on 12/24/2021 04/01/21   Zara Council A, PA-C  zinc gluconate 50 MG tablet Take 1 tablet by mouth daily.    [provider]   Physical Exam: Vitals:   12/24/21 1343 12/24/21 1347 12/24/21 1936 12/24/21 2000  BP:  (!) 157/98 124/72 132/68  Pulse:  (!) 113 (!) 114   Resp:  (!) 24 16   Temp:  98.7 F (37.1 C) 98.7 F (37.1 C)   TempSrc:  Oral Oral   SpO2:  96% 92% 93%  Weight: 99.8 kg     Height: 5' 9"  (1.753 m)      Constitutional: appears age appropriate, NAD, calm, comfortable Eyes: PERRL, lids and conjunctivae normal ENMT: Mucous membranes are moist. Posterior pharynx clear of any exudate or lesions. Age-appropriate dentition. Hearing appropriate Neck: normal, supple, no masses, no thyromegaly Respiratory: clear to auscultation bilaterally, no wheezing, no crackles. Normal respiratory effort. No accessory muscle use.  Cardiovascular: Regular rate and rhythm, no murmurs / rubs / gallops. No extremity edema. 2+ pedal pulses. No carotid bruits.  Abdomen: no tenderness, no masses palpated, no hepatosplenomegaly. Bowel sounds positive.  Musculoskeletal: no clubbing / cyanosis. No joint deformity upper and lower extremities. Good ROM, no contractures, no atrophy. Normal muscle tone.  Skin: no rashes, lesions, ulcers. No  induration Neurologic: Sensation intact. Strength 5/5 in all 4.  Psychiatric: Normal judgment and insight. Alert and oriented x 3. Normal mood.   EKG: independently reviewed, showing sinus tachycardia with rate of 113, QTc 425  Chest x-ray on Admission: I personally reviewed and I agree with radiologist reading as below.  DG Chest 2 View  Result Date: 12/24/2021 CLINICAL DATA:  Right rib pain after injury. EXAM: CHEST - 2 VIEW COMPARISON:  June 25, 2020. FINDINGS: Stable cardiomediastinal silhouette. Mild right basilar atelectasis or infiltrate is noted with small right pleural effusion. Multiple old left rib fractures are noted. IMPRESSION: Mild right basilar atelectasis or infiltrate with small right pleural effusion. Electronically Signed   By: Marijo Conception M.D.   On: 12/24/2021 14:52   CT Chest W Contrast  Addendum Date: 12/24/2021  ADDENDUM REPORT: 12/24/2021 17:48 ADDENDUM: This addendum is created to correct a voice recognition error in the clinical data. Clinical data should read as follows: Blunt chest trauma. Right lateral rib and right upper quadrant pain. Pain onset after pulling on a wrench at work and feeling a pop. Electronically Signed   By: Keith Rake M.D.   On: 12/24/2021 17:48   Result Date: 12/24/2021 CLINICAL DATA:  Blunt chest trauma. Right lateral ribbon right upper quadrant pain. Pain onset after pulling on a wrench at work and feeling a pop. EXAM: CT CHEST WITH CONTRAST TECHNIQUE: Multidetector CT imaging of the chest was performed during intravenous contrast administration. RADIATION DOSE REDUCTION: This exam was performed according to the departmental dose-optimization program which includes automated exposure control, adjustment of the mA and/or kV according to patient size and/or use of iterative reconstruction technique. CONTRAST:  6m OMNIPAQUE IOHEXOL 300 MG/ML  SOLN COMPARISON:  Radiograph earlier today.  Chest CT 06/25/2020 FINDINGS: Cardiovascular: Mild  atherosclerosis of the thoracic aorta. No acute aortic injury or acute aortic findings. The heart is normal in size. Coronary artery calcification versus stent. No pericardial effusion. Mediastinum/Nodes: No mediastinal hemorrhage or hematoma. Small scattered mediastinal lymph nodes are not enlarged by size criteria. Probable 14 mm right hilar node, series 2, image 79. No pneumomediastinum. No esophageal wall thickening. Lungs/Pleura: Moderate-sized right pleural effusion. Pleural fluid measures simple density. There is adjacent compressive atelectasis in the right lower lobe. Rounded subpleural density spanning the fissure in the anterior right lung base measures 2.6 x 2.4 cm, not seen on prior exam. This extends to the pleural surface with mild adjacent fissural and pleural thickening. Trace fluid tracks in the right major fissure. No pneumothorax. Punctate perifissural nodule in the left lower lobe, series 3, image 58, stable from prior exam. Unchanged focus of fissural thickening in the anterior right lung base series 3, image 70. Minor atelectasis in the dependent left lower lobe. No left pleural effusion. Upper Abdomen: Scattered low-density liver lesions which are not significantly changed from prior exam. Gallstone. No upper abdominal free fluid. Musculoskeletal: No acute rib fracture or acute osseous abnormality. No focal bone lesions. Remote left rib fractures are again seen. Slight loss of height of T8 superior endplate is chronic and unchanged from prior no chest wall soft tissue abnormality. IMPRESSION: 1. Moderate-sized right pleural effusion with adjacent compressive atelectasis in the right lower lobe. Pleural fluid measures simple density. 2. Rounded subpleural density spanning the fissure in the anterior right lung base measuring 2.6 x 2.4 cm, not seen on prior exam. This extends to the pleural surface with mild adjacent fissural and pleural thickening. Differential considerations include focal  atelectasis, pneumonia, or neoplasm. Recommend close CT follow-up after a course of treatment to ensure resolution, consider pulmonary follow-up. 3. Probable 14 mm right hilar node, nonspecific. 4. Coronary artery calcification versus stent. 5. Incidental cholelithiasis. 6. Unchanged low-density liver lesions, likely cysts, but incompletely characterized. Aortic Atherosclerosis (ICD10-I70.0). Electronically Signed: By: MKeith RakeM.D. On: 12/24/2021 17:28    Labs on Admission: I have personally reviewed following labs  CBC: Recent Labs  Lab 12/24/21 1615  WBC 13.0*  NEUTROABS 11.7*  HGB 14.8  HCT 46.2  MCV 86.2  PLT 4449   Basic Metabolic Panel: Recent Labs  Lab 12/24/21 1615 12/24/21 1908  NA 139  --   K 4.1  --   CL 100  --   CO2 29  --   GLUCOSE 119*  --   BUN 13  --  CREATININE 0.83  --   CALCIUM 9.4  --   MG  --  1.9  PHOS  --  3.1   GFR: Estimated Creatinine Clearance: 107.4 mL/min (by C-G formula based on SCr of 0.83 mg/dL).  Coagulation Profile: Recent Labs  Lab 12/24/21 1615  INR 1.0   Dr. Tobie Poet [facility]  If 7PM-7AM, please contact overnight-coverage provider If 7AM-7PM, please contact day coverage provider [email or web address]  12/24/2021, 9:50 PM

## 2021-12-25 ENCOUNTER — Observation Stay: Payer: Worker's Compensation

## 2021-12-25 DIAGNOSIS — I2510 Atherosclerotic heart disease of native coronary artery without angina pectoris: Secondary | ICD-10-CM | POA: Diagnosis present

## 2021-12-25 DIAGNOSIS — Z8249 Family history of ischemic heart disease and other diseases of the circulatory system: Secondary | ICD-10-CM | POA: Diagnosis not present

## 2021-12-25 DIAGNOSIS — Z825 Family history of asthma and other chronic lower respiratory diseases: Secondary | ICD-10-CM | POA: Diagnosis not present

## 2021-12-25 DIAGNOSIS — R911 Solitary pulmonary nodule: Secondary | ICD-10-CM | POA: Diagnosis present

## 2021-12-25 DIAGNOSIS — E782 Mixed hyperlipidemia: Secondary | ICD-10-CM | POA: Diagnosis present

## 2021-12-25 DIAGNOSIS — Z79899 Other long term (current) drug therapy: Secondary | ICD-10-CM | POA: Diagnosis not present

## 2021-12-25 DIAGNOSIS — J189 Pneumonia, unspecified organism: Secondary | ICD-10-CM

## 2021-12-25 DIAGNOSIS — D75839 Thrombocytosis, unspecified: Secondary | ICD-10-CM

## 2021-12-25 DIAGNOSIS — Z28310 Unvaccinated for covid-19: Secondary | ICD-10-CM | POA: Diagnosis not present

## 2021-12-25 DIAGNOSIS — Z6835 Body mass index (BMI) 35.0-35.9, adult: Secondary | ICD-10-CM | POA: Diagnosis not present

## 2021-12-25 DIAGNOSIS — Z8781 Personal history of (healed) traumatic fracture: Secondary | ICD-10-CM | POA: Diagnosis not present

## 2021-12-25 DIAGNOSIS — S2249XA Multiple fractures of ribs, unspecified side, initial encounter for closed fracture: Secondary | ICD-10-CM | POA: Diagnosis not present

## 2021-12-25 DIAGNOSIS — D72828 Other elevated white blood cell count: Secondary | ICD-10-CM | POA: Diagnosis not present

## 2021-12-25 DIAGNOSIS — F17220 Nicotine dependence, chewing tobacco, uncomplicated: Secondary | ICD-10-CM | POA: Diagnosis present

## 2021-12-25 DIAGNOSIS — A419 Sepsis, unspecified organism: Secondary | ICD-10-CM | POA: Diagnosis present

## 2021-12-25 DIAGNOSIS — I10 Essential (primary) hypertension: Secondary | ICD-10-CM | POA: Diagnosis present

## 2021-12-25 DIAGNOSIS — Z833 Family history of diabetes mellitus: Secondary | ICD-10-CM | POA: Diagnosis not present

## 2021-12-25 DIAGNOSIS — Z8616 Personal history of COVID-19: Secondary | ICD-10-CM | POA: Diagnosis not present

## 2021-12-25 DIAGNOSIS — G4733 Obstructive sleep apnea (adult) (pediatric): Secondary | ICD-10-CM | POA: Diagnosis present

## 2021-12-25 DIAGNOSIS — J90 Pleural effusion, not elsewhere classified: Secondary | ICD-10-CM | POA: Diagnosis present

## 2021-12-25 DIAGNOSIS — Z955 Presence of coronary angioplasty implant and graft: Secondary | ICD-10-CM | POA: Diagnosis not present

## 2021-12-25 DIAGNOSIS — E669 Obesity, unspecified: Secondary | ICD-10-CM | POA: Diagnosis present

## 2021-12-25 LAB — PROTEIN, PLEURAL OR PERITONEAL FLUID: Total protein, fluid: 4.3 g/dL

## 2021-12-25 LAB — BASIC METABOLIC PANEL
Anion gap: 7 (ref 5–15)
BUN: 13 mg/dL (ref 8–23)
CO2: 28 mmol/L (ref 22–32)
Calcium: 8.7 mg/dL — ABNORMAL LOW (ref 8.9–10.3)
Chloride: 101 mmol/L (ref 98–111)
Creatinine, Ser: 0.85 mg/dL (ref 0.61–1.24)
GFR, Estimated: >60 mL/min (ref >60)
Glucose, Bld: 109 mg/dL — ABNORMAL HIGH (ref 70–99)
Potassium: 4.3 mmol/L (ref 3.5–5.1)
Sodium: 136 mmol/L (ref 135–145)

## 2021-12-25 LAB — CBC
HCT: 42.8 % (ref 39.0–52.0)
Hemoglobin: 13.3 g/dL (ref 13.0–17.0)
MCH: 27 pg (ref 26.0–34.0)
MCHC: 31.1 g/dL (ref 30.0–36.0)
MCV: 87 fL (ref 80.0–100.0)
Platelets: 405 10*3/uL — ABNORMAL HIGH (ref 150–400)
RBC: 4.92 MIL/uL (ref 4.22–5.81)
RDW: 13.3 % (ref 11.5–15.5)
WBC: 16 10*3/uL — ABNORMAL HIGH (ref 4.0–10.5)
nRBC: 0 % (ref 0.0–0.2)

## 2021-12-25 LAB — BODY FLUID CELL COUNT WITH DIFFERENTIAL
Eos, Fluid: 1 %
Lymphs, Fluid: 8 %
Monocyte-Macrophage-Serous Fluid: 8 %
Neutrophil Count, Fluid: 83 %
Total Nucleated Cell Count, Fluid: 12640 cu mm

## 2021-12-25 LAB — GLUCOSE, PLEURAL OR PERITONEAL FLUID: Glucose, Fluid: 65 mg/dL

## 2021-12-25 LAB — HIV ANTIBODY (ROUTINE TESTING W REFLEX): HIV Screen 4th Generation wRfx: NONREACTIVE

## 2021-12-25 LAB — ALBUMIN, PLEURAL OR PERITONEAL FLUID: Albumin, Fluid: 2.3 g/dL

## 2021-12-25 MED ORDER — OXYCODONE HCL 5 MG PO TABS
5.0000 mg | ORAL_TABLET | Freq: Four times a day (QID) | ORAL | Status: DC | PRN
  Administered 2021-12-25 – 2021-12-28 (×6): 5 mg via ORAL
  Filled 2021-12-25 (×7): qty 1

## 2021-12-25 MED ORDER — MORPHINE SULFATE (PF) 2 MG/ML IV SOLN
2.0000 mg | INTRAVENOUS | Status: DC | PRN
  Administered 2021-12-25: 2 mg via INTRAVENOUS
  Filled 2021-12-25: qty 1

## 2021-12-25 MED ORDER — CYCLOBENZAPRINE HCL 10 MG PO TABS
5.0000 mg | ORAL_TABLET | Freq: Three times a day (TID) | ORAL | Status: DC | PRN
  Administered 2021-12-25 – 2021-12-27 (×2): 5 mg via ORAL
  Filled 2021-12-25 (×2): qty 1

## 2021-12-25 MED ORDER — KETOROLAC TROMETHAMINE 15 MG/ML IJ SOLN
15.0000 mg | Freq: Three times a day (TID) | INTRAMUSCULAR | Status: DC | PRN
  Administered 2021-12-26 – 2021-12-27 (×3): 15 mg via INTRAVENOUS
  Filled 2021-12-25 (×4): qty 1

## 2021-12-25 NOTE — Procedures (Signed)
PROCEDURE SUMMARY:  ** Procedure stopped early due to patient feeling pain/discomfort.   Successful US guided right thoracentesis. Yielded 500 ml of amber-colored fluid. Pt tolerated procedure well. No immediate complications.  Specimen sent for labs. CXR ordered; no post-procedure pneumothorax identified.   EBL < 2 mL  Mickie Kay, NP 12/25/2021 12:38 PM

## 2021-12-25 NOTE — Progress Notes (Addendum)
PROGRESS NOTE    Sean Durham  YQM:[id number] DOB: 1961/08/01 DOA: 12/24/2021 PCP: Jonetta Osgood, NP    Assessment & Plan:   Principal Problem:   Pleural effusion on right Active Problems:   Primary hypertension   Hyperlipidemia, mixed   Sepsis ([place])   Leukocytosis   OSA (obstructive sleep apnea)   CAD S/P percutaneous coronary angioplasty   History of COVID-19   Sepsis: met criteria w/ tachypnea, tachycardia, leukocytosis & possible pneumonia. Continue on IV rocephin, azithromycin, bronchodilators and  encourage incentive spirometry  Pneumonia: continue on IV rocephin, azithromycin, bronchodilators & encourage incentive spirometry   Right pleural effusion: moderate as per CT. Thoracentesis ordered by ER physician.   Right lung nodule: incidental finding on CT chest. Will need repeat CT as an outpatient. Pt is aware and verbalized his understanding   Thrombocytosis: etiology unclear. Will continue to monitor   Hx of CAD: continue on lisinopril, metoprolol  HTN: continue on metoprolol, lisinopril  Leukocytosis: likely secondary to pneumonia. Continue on IV abxs  OSA: does not use CPAP   HLD: continue on statin   Obesity: BMI 35.1. Complicates overall care & prognosis  DVT prophylaxis: lovenox  Code Status: full  Family Communication: discussed pt's care w/ pt's family at bedside and answered their questions  Disposition Plan: likely d/c back home   Level of care: Telemetry Medical  Status is: Inpatient  Remains inpatient appropriate because: severity of illness, will go for thoracentesis today      Consultants:    Procedures:  Antimicrobials: azithromycin, rocephin    Subjective: Pt c/o right sided chest wall pain   Objective: Vitals:   12/24/21 2000 12/24/21 2201 12/25/21 0442 12/25/21 0747  BP: 132/68 120/77 135/82 127/79  Pulse:  (!) 109 91 (!) 104  Resp:  _0 Temp:  98.9 F (37.2 C) 98.7 F (37.1 C) 99.1 F (37.3 C)   TempSrc:  Oral Oral Oral  SpO2: 93% 94% 97% 96%  Weight:  108 kg    Height:  5' 9" (1.753 m)      Intake/Output Summary (Last 24 hours) at 12/25/2021 0806 Last data filed at 12/25/2021 0500 Gross per 24 hour  Intake 240 ml  Output 0 ml  Net 240 ml   Filed Weights   12/24/21 1343 12/24/21 2201  Weight: 99.8 kg 108 kg    Examination:  General exam: Appears calm but uncomfortable  Respiratory system: diminished breath sounds b/l R >L  Cardiovascular system: S1 & S2 +. No  rubs, gallops or clicks.  Gastrointestinal system: Abdomen is obese, soft and nontender. Normal bowel sounds heard. Central nervous system: Alert and oriented. Moves all extremities  Psychiatry: Judgement and insight appear normal. Flat mood and affect     Data Reviewed: I have personally reviewed following labs and imaging studies  CBC: Recent Labs  Lab 12/24/21 1615 12/25/21 0516  WBC 13.0* 16.0*  NEUTROABS 11.7*  --   HGB 14.8 13.3  HCT 46.2 42.8  MCV 86.2 87.0  PLT 414* 528*   Basic Metabolic Panel: Recent Labs  Lab 12/24/21 1615 12/24/21 1908 12/25/21 0516  NA 139  --  136  K 4.1  --  4.3  CL 100  --  101  CO2 29  --  28  GLUCOSE 119*  --  109*  BUN 13  --  13  CREATININE 0.83  --  0.85  CALCIUM 9.4  --  8.7*  MG  --  1.9  --  PHOS  --  3.1  --    GFR: Estimated Creatinine Clearance: 109.1 mL/min (by C-G formula based on SCr of 0.85 mg/dL). Liver Function Tests: No results for input(s): AST, ALT, ALKPHOS, BILITOT, PROT, ALBUMIN in the last 168 hours. No results for input(s): LIPASE, AMYLASE in the last 168 hours. No results for input(s): AMMONIA in the last 168 hours. Coagulation Profile: Recent Labs  Lab 12/24/21 1615  INR 1.0   Cardiac Enzymes: No results for input(s): CKTOTAL, CKMB, CKMBINDEX, TROPONINI in the last 168 hours. BNP (last 3 results) No results for input(s): PROBNP in the last 8760 hours. HbA1C: No results for input(s): HGBA1C in the last 72  hours. CBG: No results for input(s): GLUCAP in the last 168 hours. Lipid Profile: No results for input(s): CHOL, HDL, LDLCALC, TRIG, CHOLHDL, LDLDIRECT in the last 72 hours. Thyroid Function Tests: No results for input(s): TSH, T4TOTAL, FREET4, T3FREE, THYROIDAB in the last 72 hours. Anemia Panel: No results for input(s): VITAMINB12, FOLATE, FERRITIN, TIBC, IRON, RETICCTPCT in the last 72 hours. Sepsis Labs: Recent Labs  Lab 12/24/21 1615 12/24/21 1906 12/24/21 2139  PROCALCITON <0.10  --   --   LATICACIDVEN  --  1.7 1.1    Recent Results (from the past 240 hour(s))  Resp Panel by RT-PCR (Flu A&B, Covid) Nasopharyngeal Swab     Status: None   Collection Time: 12/24/21  7:08 PM   Specimen: Nasopharyngeal Swab; Nasopharyngeal(NP) swabs in vial transport medium  Result Value Ref Range Status   SARS Coronavirus 2 by RT PCR NEGATIVE NEGATIVE Final    Comment: (NOTE) SARS-CoV-2 target nucleic acids are NOT DETECTED.  The SARS-CoV-2 RNA is generally detectable in upper respiratory specimens during the acute phase of infection. The lowest concentration of SARS-CoV-2 viral copies this assay can detect is 138 copies/mL. A negative result does not preclude SARS-Cov-2 infection and should not be used as the sole basis for treatment or other patient management decisions. A negative result may occur with  improper specimen collection/handling, submission of specimen other than nasopharyngeal swab, presence of viral mutation(s) within the areas targeted by this assay, and inadequate number of viral copies(<138 copies/mL). A negative result must be combined with clinical observations, patient history, and epidemiological information. The expected result is Negative.  Fact Sheet for Patients:  EntrepreneurPulse.com.au  Fact Sheet for Healthcare Providers:  IncredibleEmployment.be  This test is no t yet approved or cleared by the [place] FDA and   has been authorized for detection and/or diagnosis of SARS-CoV-2 by FDA under an Emergency Use Authorization (EUA). This EUA will remain  in effect (meaning this test can be used) for the duration of the COVID-19 declaration under Section 564(b)(1) of the Act, 21 U.S.C.section 360bbb-3(b)(1), unless the authorization is terminated  or revoked sooner.       Influenza A by PCR NEGATIVE NEGATIVE Final   Influenza B by PCR NEGATIVE NEGATIVE Final    Comment: (NOTE) The Xpert Xpress SARS-CoV-2/FLU/RSV plus assay is intended as an aid in the diagnosis of influenza from Nasopharyngeal swab specimens and should not be used as a sole basis for treatment. Nasal washings and aspirates are unacceptable for Xpert Xpress SARS-CoV-2/FLU/RSV testing.  Fact Sheet for Patients: EntrepreneurPulse.com.au  Fact Sheet for Healthcare Providers: IncredibleEmployment.be  This test is not yet approved or cleared by the [place] FDA and has been authorized for detection and/or diagnosis of SARS-CoV-2 by FDA under an Emergency Use Authorization (EUA). This EUA will remain in effect (meaning  this test can be used) for the duration of the COVID-19 declaration under Section 564(b)(1) of the Act, 21 U.S.C. section 360bbb-3(b)(1), unless the authorization is terminated or revoked.  Performed at [facility], [address] [place], [place] [address]   Blood Culture (routine x 2)     Status: None (Preliminary result)   Collection Time: 12/24/21  7:08 PM   Specimen: BLOOD  Result Value Ref Range Status   Specimen Description BLOOD RAC  Final   Special Requests BOTTLES DRAWN AEROBIC AND ANAEROBIC BCAV  Final   Culture   Final    NO GROWTH < 12 HOURS Performed at [facility], [address] [place], [place] [address]    Report Status PENDING  Incomplete  Blood Culture (routine x 2)     Status: None (Preliminary result)   Collection Time:  12/24/21  9:39 PM   Specimen: BLOOD  Result Value Ref Range Status   Specimen Description BLOOD RIGHT HAND  Final   Special Requests   Final    BOTTLES DRAWN AEROBIC AND ANAEROBIC Blood Culture adequate volume   Culture   Final    NO GROWTH < 12 HOURS Performed at [facility], [address], [place], [place] [address]    Report Status PENDING  Incomplete         Radiology Studies: DG Chest 2 View  Result Date: 12/24/2021 CLINICAL DATA:  Right rib pain after injury. EXAM: CHEST - 2 VIEW COMPARISON:  June 25, 2020. FINDINGS: Stable cardiomediastinal silhouette. Mild right basilar atelectasis or infiltrate is noted with small right pleural effusion. Multiple old left rib fractures are noted. IMPRESSION: Mild right basilar atelectasis or infiltrate with small right pleural effusion. Electronically Signed   By: Marijo Conception M.D.   On: 12/24/2021 14:52   CT Chest W Contrast  Addendum Date: 12/24/2021   ADDENDUM REPORT: 12/24/2021 17:48 ADDENDUM: This addendum is created to correct a voice recognition error in the clinical data. Clinical data should read as follows: Blunt chest trauma. Right lateral rib and right upper quadrant pain. Pain onset after pulling on a wrench at work and feeling a pop. Electronically Signed   By: Keith Rake M.D.   On: 12/24/2021 17:48   Result Date: 12/24/2021 CLINICAL DATA:  Blunt chest trauma. Right lateral ribbon right upper quadrant pain. Pain onset after pulling on a wrench at work and feeling a pop. EXAM: CT CHEST WITH CONTRAST TECHNIQUE: Multidetector CT imaging of the chest was performed during intravenous contrast administration. RADIATION DOSE REDUCTION: This exam was performed according to the departmental dose-optimization program which includes automated exposure control, adjustment of the mA and/or kV according to patient size and/or use of iterative reconstruction technique. CONTRAST:  67m OMNIPAQUE IOHEXOL 300 MG/ML  SOLN  COMPARISON:  Radiograph earlier today.  Chest CT 06/25/2020 FINDINGS: Cardiovascular: Mild atherosclerosis of the thoracic aorta. No acute aortic injury or acute aortic findings. The heart is normal in size. Coronary artery calcification versus stent. No pericardial effusion. Mediastinum/Nodes: No mediastinal hemorrhage or hematoma. Small scattered mediastinal lymph nodes are not enlarged by size criteria. Probable 14 mm right hilar node, series 2, image 79. No pneumomediastinum. No esophageal wall thickening. Lungs/Pleura: Moderate-sized right pleural effusion. Pleural fluid measures simple density. There is adjacent compressive atelectasis in the right lower lobe. Rounded subpleural density spanning the fissure in the anterior right lung base measures 2.6 x 2.4 cm, not seen on prior exam. This extends to the pleural surface with  mild adjacent fissural and pleural thickening. Trace fluid tracks in the right major fissure. No pneumothorax. Punctate perifissural nodule in the left lower lobe, series 3, image 58, stable from prior exam. Unchanged focus of fissural thickening in the anterior right lung base series 3, image 70. Minor atelectasis in the dependent left lower lobe. No left pleural effusion. Upper Abdomen: Scattered low-density liver lesions which are not significantly changed from prior exam. Gallstone. No upper abdominal free fluid. Musculoskeletal: No acute rib fracture or acute osseous abnormality. No focal bone lesions. Remote left rib fractures are again seen. Slight loss of height of T8 superior endplate is chronic and unchanged from prior no chest wall soft tissue abnormality. IMPRESSION: 1. Moderate-sized right pleural effusion with adjacent compressive atelectasis in the right lower lobe. Pleural fluid measures simple density. 2. Rounded subpleural density spanning the fissure in the anterior right lung base measuring 2.6 x 2.4 cm, not seen on prior exam. This extends to the pleural surface with  mild adjacent fissural and pleural thickening. Differential considerations include focal atelectasis, pneumonia, or neoplasm. Recommend close CT follow-up after a course of treatment to ensure resolution, consider pulmonary follow-up. 3. Probable 14 mm right hilar node, nonspecific. 4. Coronary artery calcification versus stent. 5. Incidental cholelithiasis. 6. Unchanged low-density liver lesions, likely cysts, but incompletely characterized. Aortic Atherosclerosis (ICD10-I70.0). Electronically Signed: By: Keith Rake M.D. On: 12/24/2021 17:28        Scheduled Meds:  enoxaparin (LOVENOX) injection  0.5 mg/kg Subcutaneous Q24H   lisinopril  10 mg Oral QHS   orphenadrine  60 mg Intramuscular Q12H   pravastatin  40 mg Oral QHS   [START ON 12/26/2021] scopolamine  1 patch Transdermal Q72H   cyanocobalamin  1,000 mcg Oral Daily   Continuous Infusions:  sodium chloride 10 mL/hr at 12/24/21 2041   azithromycin     cefTRIAXone (ROCEPHIN)  IV       LOS: 0 days    Time spent: 57 mins     Wyvonnia Dusky, MD [facility] Pager [phone]  If 7PM-7AM, please contact night-coverage 12/25/2021, 8:06 AM

## 2021-12-26 DIAGNOSIS — S2249XA Multiple fractures of ribs, unspecified side, initial encounter for closed fracture: Secondary | ICD-10-CM

## 2021-12-26 DIAGNOSIS — D72828 Other elevated white blood cell count: Secondary | ICD-10-CM

## 2021-12-26 LAB — BASIC METABOLIC PANEL
Anion gap: 6 (ref 5–15)
BUN: 17 mg/dL (ref 8–23)
CO2: 29 mmol/L (ref 22–32)
Calcium: 8.8 mg/dL — ABNORMAL LOW (ref 8.9–10.3)
Chloride: 100 mmol/L (ref 98–111)
Creatinine, Ser: 0.9 mg/dL (ref 0.61–1.24)
GFR, Estimated: >60 mL/min (ref >60)
Glucose, Bld: 103 mg/dL — ABNORMAL HIGH (ref 70–99)
Potassium: 4.3 mmol/L (ref 3.5–5.1)
Sodium: 135 mmol/L (ref 135–145)

## 2021-12-26 LAB — CBC
HCT: 42 % (ref 39.0–52.0)
Hemoglobin: 13 g/dL (ref 13.0–17.0)
MCH: 27.4 pg (ref 26.0–34.0)
MCHC: 31 g/dL (ref 30.0–36.0)
MCV: 88.4 fL (ref 80.0–100.0)
Platelets: 395 10*3/uL (ref 150–400)
RBC: 4.75 MIL/uL (ref 4.22–5.81)
RDW: 13.2 % (ref 11.5–15.5)
WBC: 17.4 10*3/uL — ABNORMAL HIGH (ref 4.0–10.5)
nRBC: 0 % (ref 0.0–0.2)

## 2021-12-26 MED ORDER — METOPROLOL TARTRATE 5 MG/5ML IV SOLN: 5.0000 mg | Freq: Four times a day (QID) | INTRAVENOUS | Status: DC | PRN

## 2021-12-26 NOTE — Progress Notes (Signed)
Patient w/ sustained tachycardia during vital signs assessment also observed hypoxic on RA O2 sat 87%.  Patient was placed on 4L [place] O2 with O2 improved to 97%, but persistent tachycardia not resolved.  MD Mayford Knife notified of changes awaiting response and Charge RN notified of tachycardia.  Will con't to monitor yellow mews initiated.

## 2021-12-26 NOTE — Clinical Social Work Note (Signed)
°  Transition of Care [facility]) Screening Note   Patient Details  Name: Sean Durham Date of Birth: 13-Nov-1961   Transition of Care [facility]) CM/SW Contact:    Maree Krabbe, LCSW Phone [phone] 12/26/2021, 8:53 AM    Transition of Care Department [facility]) has reviewed patient and no TOC needs have been identified at this time. We will continue to monitor patient advancement through interdisciplinary progression rounds. If new patient transition needs arise, please place a TOC consult.

## 2021-12-26 NOTE — Progress Notes (Addendum)
PROGRESS NOTE    Sean Durham  UXL:[id number] DOB: Apr 24, 1961 DOA: 12/24/2021 PCP: Jonetta Osgood, NP    Assessment & Plan:   Principal Problem:   Pleural effusion on right Active Problems:   Primary hypertension   Hyperlipidemia, mixed   Sepsis ([place])   Leukocytosis   OSA (obstructive sleep apnea)   CAD S/P percutaneous coronary angioplasty   History of COVID-19   Sepsis: met criteria w/ tachypnea, tachycardia, leukocytosis & possible pneumonia. Continue on IV rocephin, azithromycin, bronchodilators and  encourage incentive spirometry. Sepsis resolved   Pneumonia: continue on IV rocephin, azithromycin, bronchodilators. Encourage incentive spirometry    Right pleural effusion: moderate as per CT. S/p thoracentesis w/ 529m of pleural fluid removed. Pleural fluid cx NGTD  Multiple rib fractures: as per CXR 12/25/21 but likely remote fractures. Possibly secondary previous trauma prior to admission. Encourage incentive spirometry.   Right lung nodule: incidental finding on CT chest. Will need repeat CT as an outpatient. Pt is aware and verbalized his understanding   Thrombocytosis: resolved   Hx of CAD: continue on lisinopril, metoprolol   HTN: continue on metoprolol, lisinopril  Leukocytosis: likely secondary to infection. Continue on IV abxs   OSA: does not use CPAP   HLD: continue on statin   Obesity: BMI 35.1. Complicates overall care & prognosis  DVT prophylaxis: lovenox  Code Status: full  Family Communication: discussed pt's care w/ pt's family and answered their questions  Disposition Plan: likely d/c back home   Level of care: Telemetry Medical  Status is: Inpatient  Remains inpatient appropriate because: severity of illness, improved pain today, can likely d/c in 24-48 hrs      Consultants:    Procedures:  Antimicrobials: azithromycin, rocephin    Subjective: Pt c/o improved chest wall pain.   Objective: Vitals:   12/25/21 1926  12/25/21 1927 12/25/21 2000 12/26/21 0412  BP: 123/78  126/79 136/79  Pulse: (!) 111 (!) 109 (!) 106 92  Resp: _0 Temp: 98.8 F (37.1 C)  98.6 F (37 C) 98.3 F (36.8 C)  TempSrc: Oral   Oral  SpO2: 96% 95% 96% 95%  Weight:      Height:        Intake/Output Summary (Last 24 hours) at 12/26/2021 0757 Last data filed at 12/26/2021 0457 Gross per 24 hour  Intake 1190 ml  Output 600 ml  Net 590 ml   Filed Weights   12/24/21 1343 12/24/21 2201  Weight: 99.8 kg 108 kg    Examination:  General exam: Appears comfortable  Respiratory system: decreased breath sounds b/l Cardiovascular system: S1/S2+. No rubs or gallops  Gastrointestinal system: Abd is soft, NT, obese & normal bowel sounds Central nervous system: alert and oriented. Moves all extremities  Psychiatry: Judgement and insight appears normal. Flat mood and affect    Data Reviewed: I have personally reviewed following labs and imaging studies  CBC: Recent Labs  Lab 12/24/21 1615 12/25/21 0516 12/26/21 0509  WBC 13.0* 16.0* 17.4*  NEUTROABS 11.7*  --   --   HGB 14.8 13.3 13.0  HCT 46.2 42.8 42.0  MCV 86.2 87.0 88.4  PLT 414* 405* 3536  Basic Metabolic Panel: Recent Labs  Lab 12/24/21 1615 12/24/21 1908 12/25/21 0516 12/26/21 0509  NA 139  --  136 135  K 4.1  --  4.3 4.3  CL 100  --  101 100  CO2 29  --  28 29  GLUCOSE 119*  --  109* 103*  BUN 13  --  13 17  CREATININE 0.83  --  0.85 0.90  CALCIUM 9.4  --  8.7* 8.8*  MG  --  1.9  --   --   PHOS  --  3.1  --   --    GFR: Estimated Creatinine Clearance: 103 mL/min (by C-G formula based on SCr of 0.9 mg/dL). Liver Function Tests: No results for input(s): AST, ALT, ALKPHOS, BILITOT, PROT, ALBUMIN in the last 168 hours. No results for input(s): LIPASE, AMYLASE in the last 168 hours. No results for input(s): AMMONIA in the last 168 hours. Coagulation Profile: Recent Labs  Lab 12/24/21 1615  INR 1.0   Cardiac Enzymes: No results for  input(s): CKTOTAL, CKMB, CKMBINDEX, TROPONINI in the last 168 hours. BNP (last 3 results) No results for input(s): PROBNP in the last 8760 hours. HbA1C: No results for input(s): HGBA1C in the last 72 hours. CBG: No results for input(s): GLUCAP in the last 168 hours. Lipid Profile: No results for input(s): CHOL, HDL, LDLCALC, TRIG, CHOLHDL, LDLDIRECT in the last 72 hours. Thyroid Function Tests: No results for input(s): TSH, T4TOTAL, FREET4, T3FREE, THYROIDAB in the last 72 hours. Anemia Panel: No results for input(s): VITAMINB12, FOLATE, FERRITIN, TIBC, IRON, RETICCTPCT in the last 72 hours. Sepsis Labs: Recent Labs  Lab 12/24/21 1615 12/24/21 1906 12/24/21 2139  PROCALCITON <0.10  --   --   LATICACIDVEN  --  1.7 1.1    Recent Results (from the past 240 hour(s))  Resp Panel by RT-PCR (Flu A&B, Covid) Nasopharyngeal Swab     Status: None   Collection Time: 12/24/21  7:08 PM   Specimen: Nasopharyngeal Swab; Nasopharyngeal(NP) swabs in vial transport medium  Result Value Ref Range Status   SARS Coronavirus 2 by RT PCR NEGATIVE NEGATIVE Final    Comment: (NOTE) SARS-CoV-2 target nucleic acids are NOT DETECTED.  The SARS-CoV-2 RNA is generally detectable in upper respiratory specimens during the acute phase of infection. The lowest concentration of SARS-CoV-2 viral copies this assay can detect is 138 copies/mL. A negative result does not preclude SARS-Cov-2 infection and should not be used as the sole basis for treatment or other patient management decisions. A negative result may occur with  improper specimen collection/handling, submission of specimen other than nasopharyngeal swab, presence of viral mutation(s) within the areas targeted by this assay, and inadequate number of viral copies(<138 copies/mL). A negative result must be combined with clinical observations, patient history, and epidemiological information. The expected result is Negative.  Fact Sheet for Patients:   EntrepreneurPulse.com.au  Fact Sheet for Healthcare Providers:  IncredibleEmployment.be  This test is no t yet approved or cleared by the [place] FDA and  has been authorized for detection and/or diagnosis of SARS-CoV-2 by FDA under an Emergency Use Authorization (EUA). This EUA will remain  in effect (meaning this test can be used) for the duration of the COVID-19 declaration under Section 564(b)(1) of the Act, 21 U.S.C.section 360bbb-3(b)(1), unless the authorization is terminated  or revoked sooner.       Influenza A by PCR NEGATIVE NEGATIVE Final   Influenza B by PCR NEGATIVE NEGATIVE Final    Comment: (NOTE) The Xpert Xpress SARS-CoV-2/FLU/RSV plus assay is intended as an aid in the diagnosis of influenza from Nasopharyngeal swab specimens and should not be used as a sole basis for treatment. Nasal washings and aspirates are unacceptable for Xpert Xpress SARS-CoV-2/FLU/RSV testing.  Fact Sheet for Patients: EntrepreneurPulse.com.au  Fact Sheet for  Healthcare Providers: IncredibleEmployment.be  This test is not yet approved or cleared by the [place] and has been authorized for detection and/or diagnosis of SARS-CoV-2 by FDA under an Emergency Use Authorization (EUA). This EUA will remain in effect (meaning this test can be used) for the duration of the COVID-19 declaration under Section 564(b)(1) of the Act, 21 U.S.C. section 360bbb-3(b)(1), unless the authorization is terminated or revoked.  Performed at [facility], [address], [place], [place] [address]   Blood Culture (routine x 2)     Status: None (Preliminary result)   Collection Time: 12/24/21  7:08 PM   Specimen: BLOOD  Result Value Ref Range Status   Specimen Description BLOOD RAC  Final   Special Requests BOTTLES DRAWN AEROBIC AND ANAEROBIC BCAV  Final   Culture   Final    NO GROWTH 2 DAYS Performed at  [facility], [address], [place], [place] [address]    Report Status PENDING  Incomplete  Blood Culture (routine x 2)     Status: None (Preliminary result)   Collection Time: 12/24/21  9:39 PM   Specimen: BLOOD  Result Value Ref Range Status   Specimen Description BLOOD RIGHT HAND  Final   Special Requests   Final    BOTTLES DRAWN AEROBIC AND ANAEROBIC Blood Culture adequate volume   Culture   Final    NO GROWTH 2 DAYS Performed at [facility], [address], [place], [place] [address]    Report Status PENDING  Incomplete  Body fluid culture w Gram Stain     Status: None (Preliminary result)   Collection Time: 12/25/21 11:57 AM   Specimen: PATH Cytology Pleural fluid  Result Value Ref Range Status   Specimen Description   Final    PLEURAL Performed at [facility], [address], [place], [place] [address]    Special Requests   Final    NONE Performed at [facility], [address] [place], [place] [address]    Gram Stain   Final    MODERATE WBC PRESENT,BOTH PMN AND MONONUCLEAR NO ORGANISMS SEEN    Culture   Final    NO GROWTH < 24 HOURS Performed at [facility], [place] [address] [place], [place] [address]    Report Status PENDING  Incomplete         Radiology Studies: DG Chest 2 View  Result Date: 12/24/2021 CLINICAL DATA:  Right rib pain after injury. EXAM: CHEST - 2 VIEW COMPARISON:  June 25, 2020. FINDINGS: Stable cardiomediastinal silhouette. Mild right basilar atelectasis or infiltrate is noted with small right pleural effusion. Multiple old left rib fractures are noted. IMPRESSION: Mild right basilar atelectasis or infiltrate with small right pleural effusion. Electronically Signed   By: Marijo Conception M.D.   On: 12/24/2021 14:52   CT Chest W Contrast  Addendum Date: 12/24/2021   ADDENDUM REPORT: 12/24/2021 17:48 ADDENDUM: This addendum is created to correct a voice recognition error in the clinical data.  Clinical data should read as follows: Blunt chest trauma. Right lateral rib and right upper quadrant pain. Pain onset after pulling on a wrench at work and feeling a pop. Electronically Signed   By: Keith Rake M.D.   On: 12/24/2021 17:48   Result Date: 12/24/2021 CLINICAL DATA:  Blunt chest trauma. Right lateral ribbon right upper quadrant pain. Pain onset after pulling on a wrench at work and feeling a pop. EXAM: CT CHEST WITH  CONTRAST TECHNIQUE: Multidetector CT imaging of the chest was performed during intravenous contrast administration. RADIATION DOSE REDUCTION: This exam was performed according to the departmental dose-optimization program which includes automated exposure control, adjustment of the mA and/or kV according to patient size and/or use of iterative reconstruction technique. CONTRAST:  3m OMNIPAQUE IOHEXOL 300 MG/ML  SOLN COMPARISON:  Radiograph earlier today.  Chest CT 06/25/2020 FINDINGS: Cardiovascular: Mild atherosclerosis of the thoracic aorta. No acute aortic injury or acute aortic findings. The heart is normal in size. Coronary artery calcification versus stent. No pericardial effusion. Mediastinum/Nodes: No mediastinal hemorrhage or hematoma. Small scattered mediastinal lymph nodes are not enlarged by size criteria. Probable 14 mm right hilar node, series 2, image 79. No pneumomediastinum. No esophageal wall thickening. Lungs/Pleura: Moderate-sized right pleural effusion. Pleural fluid measures simple density. There is adjacent compressive atelectasis in the right lower lobe. Rounded subpleural density spanning the fissure in the anterior right lung base measures 2.6 x 2.4 cm, not seen on prior exam. This extends to the pleural surface with mild adjacent fissural and pleural thickening. Trace fluid tracks in the right major fissure. No pneumothorax. Punctate perifissural nodule in the left lower lobe, series 3, image 58, stable from prior exam. Unchanged focus of fissural  thickening in the anterior right lung base series 3, image 70. Minor atelectasis in the dependent left lower lobe. No left pleural effusion. Upper Abdomen: Scattered low-density liver lesions which are not significantly changed from prior exam. Gallstone. No upper abdominal free fluid. Musculoskeletal: No acute rib fracture or acute osseous abnormality. No focal bone lesions. Remote left rib fractures are again seen. Slight loss of height of T8 superior endplate is chronic and unchanged from prior no chest wall soft tissue abnormality. IMPRESSION: 1. Moderate-sized right pleural effusion with adjacent compressive atelectasis in the right lower lobe. Pleural fluid measures simple density. 2. Rounded subpleural density spanning the fissure in the anterior right lung base measuring 2.6 x 2.4 cm, not seen on prior exam. This extends to the pleural surface with mild adjacent fissural and pleural thickening. Differential considerations include focal atelectasis, pneumonia, or neoplasm. Recommend close CT follow-up after a course of treatment to ensure resolution, consider pulmonary follow-up. 3. Probable 14 mm right hilar node, nonspecific. 4. Coronary artery calcification versus stent. 5. Incidental cholelithiasis. 6. Unchanged low-density liver lesions, likely cysts, but incompletely characterized. Aortic Atherosclerosis (ICD10-I70.0). Electronically Signed: By: MKeith RakeM.D. On: 12/24/2021 17:28   DG Chest Port 1 View  Result Date: 12/25/2021 CLINICAL DATA:  Right-sided pleural effusion, status post thoracentesis. EXAM: PORTABLE CHEST 1 VIEW COMPARISON:  Yesterday FINDINGS: Multiple left rib fractures. Midline trachea. Mild cardiomegaly. Small right-sided pleural effusion is unchanged relative to yesterday's plain film. No pneumothorax. No congestive failure. Right base airspace disease is slightly increased. IMPRESSION: No pneumothorax or other acute complication after thoracentesis. Persistent small right  pleural effusion with increased adjacent airspace disease. Electronically Signed   By: KAbigail MiyamotoM.D.   On: 12/25/2021 12:33   [place]OR[place] ASP PLEURAL SPACE W/IMG GUIDE  Result Date: 12/25/2021 INDICATION: Patient presented to the ED with right-sided chest pain, blunt trauma at work, shortness of breath. Patient found to have a right pleural effusion. Interventional radiology asked to perform a diagnostic and therapeutic thoracentesis. EXAM: ULTRASOUND GUIDED THORACENTESIS MEDICATIONS: 1% lidocaine 10 mL COMPLICATIONS: None immediate. PROCEDURE: An ultrasound guided thoracentesis was thoroughly discussed with the patient and questions answered. The benefits, risks, alternatives and complications were also discussed. The patient understands and wishes to  proceed with the procedure. Written consent was obtained. Ultrasound was performed to localize and mark an adequate pocket of fluid in the right chest. The area was then prepped and draped in the normal sterile fashion. 1% Lidocaine was used for local anesthesia. Under ultrasound guidance a 6 Fr Safe-T-Centesis catheter was introduced. Thoracentesis was performed. The procedure was stopped early due to patient pain/discomfort. The catheter was removed and a dressing applied. FINDINGS: A total of approximately 500 mL of amber-colored fluid was removed. Samples were sent to the laboratory as requested by the clinical team. IMPRESSION: Successful ultrasound guided right thoracentesis yielding 500 mL of pleural fluid. Read by: Soyla Dryer, NP Electronically Signed   By: Markus Daft M.D.   On: 12/25/2021 13:08        Scheduled Meds:  enoxaparin (LOVENOX) injection  0.5 mg/kg Subcutaneous Q24H   lisinopril  10 mg Oral QHS   pravastatin  40 mg Oral QHS   cyanocobalamin  1,000 mcg Oral Daily   Continuous Infusions:  sodium chloride 10 mL/hr at 12/24/21 2041   azithromycin Stopped (12/25/21 1849)   cefTRIAXone (ROCEPHIN)  IV Stopped (12/25/21  1931)     LOS: 1 day    Time spent: 25 mins     Wyvonnia Dusky, MD [facility] Pager [phone]  If 7PM-7AM, please contact night-coverage 12/26/2021, 7:57 AM

## 2021-12-27 DIAGNOSIS — I10 Essential (primary) hypertension: Secondary | ICD-10-CM

## 2021-12-27 LAB — CBC
HCT: 42.5 % (ref 39.0–52.0)
Hemoglobin: 13.4 g/dL (ref 13.0–17.0)
MCH: 27.7 pg (ref 26.0–34.0)
MCHC: 31.5 g/dL (ref 30.0–36.0)
MCV: 88 fL (ref 80.0–100.0)
Platelets: 403 10*3/uL — ABNORMAL HIGH (ref 150–400)
RBC: 4.83 MIL/uL (ref 4.22–5.81)
RDW: 13 % (ref 11.5–15.5)
WBC: 14.9 10*3/uL — ABNORMAL HIGH (ref 4.0–10.5)
nRBC: 0 % (ref 0.0–0.2)

## 2021-12-27 LAB — BASIC METABOLIC PANEL
Anion gap: 10 (ref 5–15)
BUN: 13 mg/dL (ref 8–23)
CO2: 28 mmol/L (ref 22–32)
Calcium: 8.6 mg/dL — ABNORMAL LOW (ref 8.9–10.3)
Chloride: 97 mmol/L — ABNORMAL LOW (ref 98–111)
Creatinine, Ser: 0.78 mg/dL (ref 0.61–1.24)
GFR, Estimated: >60 mL/min (ref >60)
Glucose, Bld: 127 mg/dL — ABNORMAL HIGH (ref 70–99)
Potassium: 4.2 mmol/L (ref 3.5–5.1)
Sodium: 135 mmol/L (ref 135–145)

## 2021-12-27 MED ORDER — OXYCODONE HCL 5 MG PO TABS: 5.0000 mg | ORAL_TABLET | Freq: Four times a day (QID) | ORAL | 0 refills | Status: AC | PRN

## 2021-12-27 MED ORDER — ACETAMINOPHEN 325 MG PO TABS: 650.0000 mg | ORAL_TABLET | Freq: Four times a day (QID) | ORAL | Status: AC | PRN

## 2021-12-27 MED ORDER — AZITHROMYCIN 250 MG PO TABS: 250.0000 mg | ORAL_TABLET | Freq: Every day | ORAL | 0 refills | Status: DC

## 2021-12-27 MED ORDER — METOPROLOL SUCCINATE ER 25 MG PO TB24
12.5000 mg | ORAL_TABLET | Freq: Every day | ORAL | Status: DC
  Administered 2021-12-27 – 2021-12-28 (×2): 12.5 mg via ORAL
  Filled 2021-12-27 (×2): qty 1

## 2021-12-27 MED ORDER — AZITHROMYCIN 250 MG PO TABS
500.0000 mg | ORAL_TABLET | Freq: Every day | ORAL | Status: AC
  Administered 2021-12-27 – 2021-12-28 (×2): 500 mg via ORAL
  Filled 2021-12-27 (×2): qty 2

## 2021-12-27 NOTE — TOC Transition Note (Addendum)
Transition of Care [facility]) - CM/SW Discharge Note   Patient Details  Name: Sean Durham MRN: [id number] Date of Birth: 1961-03-16  Transition of Care [facility] CM/SW Contact:  Bing Quarry, RN Phone Number: 12/27/2021, 12:01 PM   Clinical Narrative: 1/28: Possible discharge to home/self care. DME oxygen ordered and [employer] notified per Elease Hashimoto. DME order in and qualifying test by Unit RN. Gabriel Cirri RN CM     Update: Per [employer], patient was refusing DME home oxygen. Notified provider via secure chat. Gabriel Cirri RN CM   Final next level of care: Home w Home Health Services Barriers to Discharge: Barriers Resolved   Patient Goals and CMS Choice        Discharge Placement                       Discharge Plan and Services                DME Arranged: Oxygen DME Agency: [employer] Date DME Agency Contacted: 12/27/21 Time DME Agency Contacted: 1200 Representative spoke with at DME Agency: Elease Hashimoto HH Arranged: NA HH Agency: NA        Social Determinants of Health (SDOH) Interventions     Readmission Risk Interventions No flowsheet data found.

## 2021-12-27 NOTE — Progress Notes (Signed)
Cross Cover Home dose metoprolol restarted

## 2021-12-27 NOTE — Progress Notes (Signed)
Just ambulated patient; at rest RA O2 95%, with ambulation came down to RA 88%, recovery on 2L/min via [place] 95%.

## 2021-12-27 NOTE — Progress Notes (Signed)
PHARMACIST - PHYSICIAN COMMUNICATION ° °CONCERNING: Antibiotic IV to Oral Route Change Policy ° °RECOMMENDATION: °This patient is receiving azithromycin by the intravenous route.  Based on criteria approved by the Pharmacy and Therapeutics Committee, the antibiotic(s) is/are being converted to the equivalent oral dose form(s). ° ° °DESCRIPTION: °These criteria include: °Patient being treated for a respiratory tract infection, urinary tract infection, cellulitis or clostridium difficile associated diarrhea if on metronidazole °The patient is not neutropenic and does not exhibit a GI malabsorption state °The patient is eating (either orally or via tube) and/or has been taking other orally administered medications for a least 24 hours °The patient is improving clinically and has a Tmax < 100.5 ° °If you have questions about this conversion, please contact the Pharmacy Department  ° °Sean Durham  °12/27/21  °  °

## 2021-12-27 NOTE — Progress Notes (Deleted)
PROGRESS NOTE    Sean Durham  ERD:[id number] DOB: October 18, 1961 DOA: 12/24/2021 PCP: Jonetta Osgood, NP    Assessment & Plan:   Principal Problem:   Pleural effusion on right Active Problems:   Primary hypertension   Hyperlipidemia, mixed   Sepsis ([place])   Leukocytosis   OSA (obstructive sleep apnea)   CAD S/P percutaneous coronary angioplasty   History of COVID-19   Sepsis: met criteria w/ tachypnea, tachycardia, leukocytosis & possible pneumonia. D/c rocephin and start unasyn & continue azithromycin for worsening pneumonia seen on repeat CXR today. Continue on supplemental oxygen and wean as tolerated. Desaturated to 85% on RA today. Will likely need oxygen at d/c  Pneumonia:  repeat CXR today shows worsening pneumonia. D/c rocephin and start unasyn and continue on azithromycin. Continue on bronchodilators and encourage incentive spirometry   Right pleural effusion: moderate as per CT. S/p thoracentesis w/ 568m of pleural fluid removed.  Pleural fluid NGTD  Multiple left rib fractures: as per CXR 12/25/21 but likely remote fractures. Possibly secondary previous trauma prior to admission. Encourage incentive spirometry. Oxycodone prn   Right lung nodule: incidental finding on CT chest. Will need repeat CT as an outpatient in several weeks. Pt is aware and verbalized his understanding   Thrombocytosis: labile, likely reactive. Will continue to monitor   Hx of CAD: continue on metoprolol, lisinopril   HTN: continue on lisinopril, metoprolol  Leukocytosis: likely secondary to infection. Continue on abxs    OSA: does not use CPAP   HLD: continue on statin   Obesity: BMI 356.3Complicates overall care & prognosis    DVT prophylaxis: lovenox  Code Status: full  Family Communication:  Disposition Plan: likely d/c back home   Level of care: Telemetry Medical  Status is: Inpatient  Remains inpatient appropriate because: severity of illness, worsening pneumonia per  CXR but pt's wants a second opinion on treatment and refusing change in abxs today. Also pt was upset about having to give to put a credit card down for home oxygen use. See CM's note for more information. Another hospitalist will take over pt's care tomorrow for his second opinion     Consultants:    Procedures:  Antimicrobials: azithromycin, unasyn    Subjective: Pt c/o fatigue and improved chest wall pain   Objective: Vitals:   12/27/21 2059 12/27/21 2110 12/28/21 0548 12/28/21 0853  BP: (!) 152/89  121/79 130/84  Pulse: (!) 113 (!) 111 (!) 104 (!) 102  Resp: 20  20 20   Temp: 99.5 F (37.5 C)  98.7 F (37.1 C) 98.7 F (37.1 C)  TempSrc:   Oral Oral  SpO2: 95% 98% 94% 96%  Weight:      Height:        Intake/Output Summary (Last 24 hours) at 12/28/2021 1348 Last data filed at 12/28/2021 [id number] per 24 hour  Intake 240.41 ml  Output --  Net 240.41 ml   Filed Weights   12/24/21 1343 12/24/21 2201  Weight: 99.8 kg 108 kg    Examination:  General exam: Appears agitated & frustrated Respiratory system: decreased breath sounds b/l R>L   Cardiovascular system: S1/S2+. No rubs or clicks  Gastrointestinal system: Abd is soft, NT, obese & normal bowel sounds Central nervous system: alert and oriented. Moves all extremities  Psychiatry: judgement and insight appears poor. Frustrated and agitated mood & affect     Data Reviewed: I have personally reviewed following labs and imaging studies  CBC: Recent Labs  Lab 12/24/21  1615 12/25/21 0516 12/26/21 0509 12/27/21 0718 12/28/21 0510  WBC 13.0* 16.0* 17.4* 14.9* 14.5*  NEUTROABS 11.7*  --   --   --   --   HGB 14.8 13.3 13.0 13.4 13.3  HCT 46.2 42.8 42.0 42.5 41.9  MCV 86.2 87.0 88.4 88.0 84.5  PLT 414* 405* 395 403* 268*   Basic Metabolic Panel: Recent Labs  Lab 12/24/21 1615 12/24/21 1908 12/25/21 0516 12/26/21 0509 12/27/21 0718 12/28/21 0510  NA 139  --  136 135 135 135  K 4.1  --  4.3 4.3 4.2  4.5  CL 100  --  101 100 97* 96*  CO2 29  --  28 29 28 29   GLUCOSE 119*  --  109* 103* 127* 128*  BUN 13  --  13 17 13 14   CREATININE 0.83  --  0.85 0.90 0.78 0.96  CALCIUM 9.4  --  8.7* 8.8* 8.6* 9.0  MG  --  1.9  --   --   --   --   PHOS  --  3.1  --   --   --   --    GFR: Estimated Creatinine Clearance: 96.6 mL/min (by C-G formula based on SCr of 0.96 mg/dL). Liver Function Tests: No results for input(s): AST, ALT, ALKPHOS, BILITOT, PROT, ALBUMIN in the last 168 hours. No results for input(s): LIPASE, AMYLASE in the last 168 hours. No results for input(s): AMMONIA in the last 168 hours. Coagulation Profile: Recent Labs  Lab 12/24/21 1615  INR 1.0   Cardiac Enzymes: No results for input(s): CKTOTAL, CKMB, CKMBINDEX, TROPONINI in the last 168 hours. BNP (last 3 results) No results for input(s): PROBNP in the last 8760 hours. HbA1C: No results for input(s): HGBA1C in the last 72 hours. CBG: No results for input(s): GLUCAP in the last 168 hours. Lipid Profile: No results for input(s): CHOL, HDL, LDLCALC, TRIG, CHOLHDL, LDLDIRECT in the last 72 hours. Thyroid Function Tests: No results for input(s): TSH, T4TOTAL, FREET4, T3FREE, THYROIDAB in the last 72 hours. Anemia Panel: No results for input(s): VITAMINB12, FOLATE, FERRITIN, TIBC, IRON, RETICCTPCT in the last 72 hours. Sepsis Labs: Recent Labs  Lab 12/24/21 1615 12/24/21 1906 12/24/21 2139  PROCALCITON <0.10  --   --   LATICACIDVEN  --  1.7 1.1    Recent Results (from the past 240 hour(s))  Resp Panel by RT-PCR (Flu A&B, Covid) Nasopharyngeal Swab     Status: None   Collection Time: 12/24/21  7:08 PM   Specimen: Nasopharyngeal Swab; Nasopharyngeal(NP) swabs in vial transport medium  Result Value Ref Range Status   SARS Coronavirus 2 by RT PCR NEGATIVE NEGATIVE Final    Comment: (NOTE) SARS-CoV-2 target nucleic acids are NOT DETECTED.  The SARS-CoV-2 RNA is generally detectable in upper respiratory specimens  during the acute phase of infection. The lowest concentration of SARS-CoV-2 viral copies this assay can detect is 138 copies/mL. A negative result does not preclude SARS-Cov-2 infection and should not be used as the sole basis for treatment or other patient management decisions. A negative result may occur with  improper specimen collection/handling, submission of specimen other than nasopharyngeal swab, presence of viral mutation(s) within the areas targeted by this assay, and inadequate number of viral copies(<138 copies/mL). A negative result must be combined with clinical observations, patient history, and epidemiological information. The expected result is Negative.  Fact Sheet for Patients:  EntrepreneurPulse.com.au  Fact Sheet for Healthcare Providers:  IncredibleEmployment.be  This test is no  t yet approved or cleared by the [place] and  has been authorized for detection and/or diagnosis of SARS-CoV-2 by FDA under an Emergency Use Authorization (EUA). This EUA will remain  in effect (meaning this test can be used) for the duration of the COVID-19 declaration under Section 564(b)(1) of the Act, 21 U.S.C.section 360bbb-3(b)(1), unless the authorization is terminated  or revoked sooner.       Influenza A by PCR NEGATIVE NEGATIVE Final   Influenza B by PCR NEGATIVE NEGATIVE Final    Comment: (NOTE) The Xpert Xpress SARS-CoV-2/FLU/RSV plus assay is intended as an aid in the diagnosis of influenza from Nasopharyngeal swab specimens and should not be used as a sole basis for treatment. Nasal washings and aspirates are unacceptable for Xpert Xpress SARS-CoV-2/FLU/RSV testing.  Fact Sheet for Patients: EntrepreneurPulse.com.au  Fact Sheet for Healthcare Providers: IncredibleEmployment.be  This test is not yet approved or cleared by the [place] FDA and has been authorized for detection  and/or diagnosis of SARS-CoV-2 by FDA under an Emergency Use Authorization (EUA). This EUA will remain in effect (meaning this test can be used) for the duration of the COVID-19 declaration under Section 564(b)(1) of the Act, 21 U.S.C. section 360bbb-3(b)(1), unless the authorization is terminated or revoked.  Performed at [facility], [address] [place], [place] [address]   Blood Culture (routine x 2)     Status: None (Preliminary result)   Collection Time: 12/24/21  7:08 PM   Specimen: BLOOD  Result Value Ref Range Status   Specimen Description BLOOD RAC  Final   Special Requests BOTTLES DRAWN AEROBIC AND ANAEROBIC BCAV  Final   Culture   Final    NO GROWTH 4 DAYS Performed at [facility], [address], [place], [place] [address]    Report Status PENDING  Incomplete  Blood Culture (routine x 2)     Status: None (Preliminary result)   Collection Time: 12/24/21  9:39 PM   Specimen: BLOOD  Result Value Ref Range Status   Specimen Description BLOOD RIGHT HAND  Final   Special Requests   Final    BOTTLES DRAWN AEROBIC AND ANAEROBIC Blood Culture adequate volume   Culture   Final    NO GROWTH 4 DAYS Performed at [facility], [address], [place], [place] [address]    Report Status PENDING  Incomplete  Body fluid culture w Gram Stain     Status: None (Preliminary result)   Collection Time: 12/25/21 11:57 AM   Specimen: PATH Cytology Pleural fluid  Result Value Ref Range Status   Specimen Description   Final    PLEURAL Performed at [facility], [address] [place], [place] [address]    Special Requests   Final    NONE Performed at [facility], [address], [place], [place] [address]    Gram Stain   Final    MODERATE WBC PRESENT,BOTH PMN AND MONONUCLEAR NO ORGANISMS SEEN    Culture   Final    NO GROWTH 3 DAYS Performed at [facility], [place] [address] [place], [place] [address]    Report Status  PENDING  Incomplete         Radiology Studies: DG Chest Port 1 View  Result Date: 12/28/2021 CLINICAL DATA:  Right rib pain after injury. EXAM: PORTABLE CHEST 1 VIEW COMPARISON:  December 25, 2021. FINDINGS: The heart size and mediastinal contours are within normal limits. Left lung is clear.  No pneumothorax is noted. Increased diffuse lung opacity is noted concerning for pneumonia or atelectasis and associated pleural effusion. Old left rib fractures are noted. IMPRESSION: Increased right lung opacity is noted concerning for worsening pneumonia or atelectasis with associated pleural effusion. No pneumothorax is noted. Electronically Signed   By: Marijo Conception M.D.   On: 12/28/2021 10:53        Scheduled Meds:  azithromycin  500 mg Oral Daily   enoxaparin (LOVENOX) injection  0.5 mg/kg Subcutaneous Q24H   lisinopril  10 mg Oral QHS   metoprolol succinate  12.5 mg Oral QHS   pravastatin  40 mg Oral QHS   cyanocobalamin  1,000 mcg Oral Daily   Continuous Infusions:  sodium chloride 10 mL/hr at 12/24/21 2041   ampicillin-sulbactam (UNASYN) IV       LOS: 3 days    Time spent: 15 mins     Wyvonnia Dusky, MD [facility] Pager [phone]  If 7PM-7AM, please contact night-coverage 12/28/2021, 1:48 PM

## 2021-12-27 NOTE — Progress Notes (Deleted)
PROGRESS NOTE    Sean Durham  AFB:[id number] DOB: 07-17-1961 DOA: 12/24/2021 PCP: Jonetta Osgood, NP    Assessment & Plan:   Principal Problem:   Pleural effusion on right Active Problems:   Primary hypertension   Hyperlipidemia, mixed   Sepsis ([place])   Leukocytosis   OSA (obstructive sleep apnea)   CAD S/P percutaneous coronary angioplasty   History of COVID-19   Sepsis: met criteria w/ tachypnea, tachycardia, leukocytosis & possible pneumonia. Continue on IV rocephin, azithromycin, bronchodilators and encourage incentive spirometry. Continue on supplemental oxygen and wean as tolerated. Desaturated to 88% on RA w/ exertion only.   Pneumonia: continue on IV rocephin, azithromycin, bronchodilators. Encourage incentive spirometry   Right pleural effusion: moderate as per CT. S/p thoracentesis w/ 577m of pleural fluid removed.  Pleural fluid NGTD   Multiple rib fractures: as per CXR 12/25/21 but likely remote fractures. Possibly secondary previous trauma prior to admission. Encourage incentive spirometry. Toradol prn  Right lung nodule: incidental finding on CT chest. Will need repeat CT as an outpatient. Pt is aware and verbalized his understanding   Thrombocytosis: labile. Will continue to monitor   Hx of CAD: continue on BB, ACE-I   HTN: continue on lisinopril, metoprolol  Leukocytosis: likely secondary to infection. Continue on abxs   OSA: does not use CPAP   HLD: continue on statin    Obesity: BMI 35.1. Complicates overall care & prognosis    DVT prophylaxis: lovenox  Code Status: full  Family Communication: discussed pt's care w/ pt's family and answered their questions  Disposition Plan: likely d/c back home   Level of care: Telemetry Medical  Status is: Inpatient  Remains inpatient appropriate because: severity of illness, can likely d/c in 24 hrs      Consultants:    Procedures:  Antimicrobials: azithromycin, rocephin     Subjective: Pt c/o fatigue and improved chest wall pain   Objective: Vitals:   12/26/21 2206 12/27/21 0205 12/27/21 0459 12/27/21 0722  BP: 137/80 138/86 103/61 117/76  Pulse: (!) 109 (!) 110 (!) 107 (!) 104  Resp: _0 Temp: 99.5 F (37.5 C) 98.9 F (37.2 C) 99.5 F (37.5 C) (!) 97.3 F (36.3 C)  TempSrc: Oral Oral Oral Oral  SpO2: 96% 96% 93% 94%  Weight:      Height:        Intake/Output Summary (Last 24 hours) at 12/27/2021 1407 Last data filed at 12/27/2021 0528 Gross per 24 hour  Intake --  Output 0 ml  Net 0 ml   Filed Weights   12/24/21 1343 12/24/21 2201  Weight: 99.8 kg 108 kg    Examination:  General exam: Appears calm & comfortable  Respiratory system: diminished breath sounds b/l  Cardiovascular system: S1 & S2+. No rubs or clicks   Gastrointestinal system: Abd is soft, NT, obese & hypoactive bowel sounds  Central nervous system: alert and oriented. Moves all extremities  Psychiatry: Judgement and insight appears normal. Flat mood and affect     Data Reviewed: I have personally reviewed following labs and imaging studies  CBC: Recent Labs  Lab 12/24/21 1615 12/25/21 0516 12/26/21 0509 12/27/21 0718  WBC 13.0* 16.0* 17.4* 14.9*  NEUTROABS 11.7*  --   --   --   HGB 14.8 13.3 13.0 13.4  HCT 46.2 42.8 42.0 42.5  MCV 86.2 87.0 88.4 88.0  PLT 414* 405* 395 4291   Basic Metabolic Panel: Recent Labs  Lab 12/24/21 1615 12/24/21 1908  12/25/21 0516 12/26/21 0509 12/27/21 0718  NA 139  --  136 135 135  K 4.1  --  4.3 4.3 4.2  CL 100  --  101 100 97*  CO2 29  --  _0 GLUCOSE 119*  --  109* 103* 127*  BUN 13  --  _1 CREATININE 0.83  --  0.85 0.90 0.78  CALCIUM 9.4  --  8.7* 8.8* 8.6*  MG  --  1.9  --   --   --   PHOS  --  3.1  --   --   --    GFR: Estimated Creatinine Clearance: 115.9 mL/min (by C-G formula based on SCr of 0.78 mg/dL). Liver Function Tests: No results for input(s): AST, ALT, ALKPHOS, BILITOT,  PROT, ALBUMIN in the last 168 hours. No results for input(s): LIPASE, AMYLASE in the last 168 hours. No results for input(s): AMMONIA in the last 168 hours. Coagulation Profile: Recent Labs  Lab 12/24/21 1615  INR 1.0   Cardiac Enzymes: No results for input(s): CKTOTAL, CKMB, CKMBINDEX, TROPONINI in the last 168 hours. BNP (last 3 results) No results for input(s): PROBNP in the last 8760 hours. HbA1C: No results for input(s): HGBA1C in the last 72 hours. CBG: No results for input(s): GLUCAP in the last 168 hours. Lipid Profile: No results for input(s): CHOL, HDL, LDLCALC, TRIG, CHOLHDL, LDLDIRECT in the last 72 hours. Thyroid Function Tests: No results for input(s): TSH, T4TOTAL, FREET4, T3FREE, THYROIDAB in the last 72 hours. Anemia Panel: No results for input(s): VITAMINB12, FOLATE, FERRITIN, TIBC, IRON, RETICCTPCT in the last 72 hours. Sepsis Labs: Recent Labs  Lab 12/24/21 1615 12/24/21 1906 12/24/21 2139  PROCALCITON <0.10  --   --   LATICACIDVEN  --  1.7 1.1    Recent Results (from the past 240 hour(s))  Resp Panel by RT-PCR (Flu A&B, Covid) Nasopharyngeal Swab     Status: None   Collection Time: 12/24/21  7:08 PM   Specimen: Nasopharyngeal Swab; Nasopharyngeal(NP) swabs in vial transport medium  Result Value Ref Range Status   SARS Coronavirus 2 by RT PCR NEGATIVE NEGATIVE Final    Comment: (NOTE) SARS-CoV-2 target nucleic acids are NOT DETECTED.  The SARS-CoV-2 RNA is generally detectable in upper respiratory specimens during the acute phase of infection. The lowest concentration of SARS-CoV-2 viral copies this assay can detect is 138 copies/mL. A negative result does not preclude SARS-Cov-2 infection and should not be used as the sole basis for treatment or other patient management decisions. A negative result may occur with  improper specimen collection/handling, submission of specimen other than nasopharyngeal swab, presence of viral mutation(s) within  the areas targeted by this assay, and inadequate number of viral copies(<138 copies/mL). A negative result must be combined with clinical observations, patient history, and epidemiological information. The expected result is Negative.  Fact Sheet for Patients:  EntrepreneurPulse.com.au  Fact Sheet for Healthcare Providers:  IncredibleEmployment.be  This test is no t yet approved or cleared by the [place] FDA and  has been authorized for detection and/or diagnosis of SARS-CoV-2 by FDA under an Emergency Use Authorization (EUA). This EUA will remain  in effect (meaning this test can be used) for the duration of the COVID-19 declaration under Section 564(b)(1) of the Act, 21 U.S.C.section 360bbb-3(b)(1), unless the authorization is terminated  or revoked sooner.       Influenza A by PCR NEGATIVE NEGATIVE Final   Influenza B by PCR NEGATIVE NEGATIVE  Final    Comment: (NOTE) The Xpert Xpress SARS-CoV-2/FLU/RSV plus assay is intended as an aid in the diagnosis of influenza from Nasopharyngeal swab specimens and should not be used as a sole basis for treatment. Nasal washings and aspirates are unacceptable for Xpert Xpress SARS-CoV-2/FLU/RSV testing.  Fact Sheet for Patients: EntrepreneurPulse.com.au  Fact Sheet for Healthcare Providers: IncredibleEmployment.be  This test is not yet approved or cleared by the [place] FDA and has been authorized for detection and/or diagnosis of SARS-CoV-2 by FDA under an Emergency Use Authorization (EUA). This EUA will remain in effect (meaning this test can be used) for the duration of the COVID-19 declaration under Section 564(b)(1) of the Act, 21 U.S.C. section 360bbb-3(b)(1), unless the authorization is terminated or revoked.  Performed at [facility], [address] [place], [place] [address]   Blood Culture (routine x 2)     Status: None  (Preliminary result)   Collection Time: 12/24/21  7:08 PM   Specimen: BLOOD  Result Value Ref Range Status   Specimen Description BLOOD RAC  Final   Special Requests BOTTLES DRAWN AEROBIC AND ANAEROBIC BCAV  Final   Culture   Final    NO GROWTH 3 DAYS Performed at [facility], [address], [place], [place] [address]    Report Status PENDING  Incomplete  Blood Culture (routine x 2)     Status: None (Preliminary result)   Collection Time: 12/24/21  9:39 PM   Specimen: BLOOD  Result Value Ref Range Status   Specimen Description BLOOD RIGHT HAND  Final   Special Requests   Final    BOTTLES DRAWN AEROBIC AND ANAEROBIC Blood Culture adequate volume   Culture   Final    NO GROWTH 3 DAYS Performed at [facility], [address] [place], [place] [address]    Report Status PENDING  Incomplete  Body fluid culture w Gram Stain     Status: None (Preliminary result)   Collection Time: 12/25/21 11:57 AM   Specimen: PATH Cytology Pleural fluid  Result Value Ref Range Status   Specimen Description   Final    PLEURAL Performed at [facility], [address] [place], [place] [address]    Special Requests   Final    NONE Performed at [facility], [place] [place], [place] [address]    Gram Stain   Final    MODERATE WBC PRESENT,BOTH PMN AND MONONUCLEAR NO ORGANISMS SEEN    Culture   Final    NO GROWTH 2 DAYS Performed at [facility], [place] [address], [place], [place] [address]    Report Status PENDING  Incomplete         Radiology Studies: No results found.      Scheduled Meds:  azithromycin  500 mg Oral Daily   enoxaparin (LOVENOX) injection  0.5 mg/kg Subcutaneous Q24H   lisinopril  10 mg Oral QHS   pravastatin  40 mg Oral QHS   cyanocobalamin  1,000 mcg Oral Daily   Continuous Infusions:  sodium chloride 10 mL/hr at 12/24/21 2041   cefTRIAXone (ROCEPHIN)  IV 2 g (12/26/21 1758)     LOS: 2 days    Time  spent: 15 mins     Wyvonnia Dusky, MD [facility] Pager [phone]  If 7PM-7AM, please contact night-coverage 12/27/2021, 2:07 PM

## 2021-12-28 ENCOUNTER — Inpatient Hospital Stay: Payer: Worker's Compensation

## 2021-12-28 LAB — CBC
HCT: 41.9 % (ref 39.0–52.0)
Hemoglobin: 13.3 g/dL (ref 13.0–17.0)
MCH: 26.8 pg (ref 26.0–34.0)
MCHC: 31.7 g/dL (ref 30.0–36.0)
MCV: 84.5 fL (ref 80.0–100.0)
Platelets: 430 10*3/uL — ABNORMAL HIGH (ref 150–400)
RBC: 4.96 MIL/uL (ref 4.22–5.81)
RDW: 13.2 % (ref 11.5–15.5)
WBC: 14.5 10*3/uL — ABNORMAL HIGH (ref 4.0–10.5)
nRBC: 0 % (ref 0.0–0.2)

## 2021-12-28 LAB — BODY FLUID CULTURE W GRAM STAIN: Culture: NO GROWTH

## 2021-12-28 LAB — BASIC METABOLIC PANEL
Anion gap: 10 (ref 5–15)
BUN: 14 mg/dL (ref 8–23)
CO2: 29 mmol/L (ref 22–32)
Calcium: 9 mg/dL (ref 8.9–10.3)
Chloride: 96 mmol/L — ABNORMAL LOW (ref 98–111)
Creatinine, Ser: 0.96 mg/dL (ref 0.61–1.24)
GFR, Estimated: >60 mL/min (ref >60)
Glucose, Bld: 128 mg/dL — ABNORMAL HIGH (ref 70–99)
Potassium: 4.5 mmol/L (ref 3.5–5.1)
Sodium: 135 mmol/L (ref 135–145)

## 2021-12-28 MED ORDER — SODIUM CHLORIDE 0.9 % IV SOLN
1.5000 g | Freq: Four times a day (QID) | INTRAVENOUS | Status: DC
  Administered 2021-12-28 – 2021-12-29 (×3): 1.5 g via INTRAVENOUS
  Filled 2021-12-28: qty 4
  Filled 2021-12-28: qty 1.5
  Filled 2021-12-28 (×2): qty 4
  Filled 2021-12-28: qty 1.5
  Filled 2021-12-28: qty 4

## 2021-12-28 NOTE — TOC Progression Note (Addendum)
Transition of Care [facility]) - Progression Note    Patient Details  Name: GIULIO BERTINO MRN: [id number] Date of Birth: [birth year]  [facility]) CM/SW Contact  Bing Quarry, RN Phone Number: 12/28/2021, 11:35 AM  Clinical Narrative: See below update.  1/29: Barrier to discharge yesterday was getting oxygen DME set up for home. Worker's Sport and exercise psychologist verified but credit card required. Patient at first simply refused home oxygen and provider notified. Unit RN just notified RN CM that family has a way to pay. Notified [employer] and [employer] will follow up on payment with family and proceed with set up again for today. TOC will continue to monitor this through discharge. Gabriel Cirri RN CM   [employer] INSUR[employer] (Worker's [employer] information).  WC Subscriber ID #[id number]   Subscriber name: [id number] OTHER [id number] Phone verified 12/24/21.    UPDATE: 1210 pm. Discharge canceled due to worsening pnuemonia per provider notification via secure chat. Gabriel Cirri RN CM    Barriers to Discharge: Other (must enter comment) (1/28: Was unable to provide a credit card for oxygen set up at home. Worker's Visual merchandiser.)  Expected Discharge Plan and Services           Expected Discharge Date: 12/27/21               DME Arranged: Oxygen DME Agency: [employer] Date DME Agency Contacted: 12/28/21 Time DME Agency Contacted: 1134 Representative spoke with at DME Agency: Elease Hashimoto HH Arranged: NA HH Agency: NA         Social Determinants of Health (SDOH) Interventions    Readmission Risk Interventions No flowsheet data found.

## 2021-12-28 NOTE — Progress Notes (Signed)
Ambulated with patient to perform walking oxygen saturation test.  SaO2 on room air at rest = 97% SaO2 on room air while ambulating = 85% SaO2 on 2 liters of O2 while ambulating = 95%   Patient ambulated 3 laps in hallway, placed back in bed, no acute distress noted. Call bell within reach and bed in lowest position.

## 2021-12-28 NOTE — Progress Notes (Signed)
PROGRESS NOTE    Sean Durham  HGD:[id number] DOB: 01/13/1961 DOA: 12/24/2021 PCP: Jonetta Osgood, NP    Assessment & Plan:   Principal Problem:   Pleural effusion on right Active Problems:   Primary hypertension   Hyperlipidemia, mixed   Sepsis ([place])   Leukocytosis   OSA (obstructive sleep apnea)   CAD S/P percutaneous coronary angioplasty   History of COVID-19   Sepsis: met criteria w/ tachypnea, tachycardia, leukocytosis & possible pneumonia. D/c rocephin and start unasyn & continue azithromycin for worsening pneumonia seen on repeat CXR today. Continue on supplemental oxygen and wean as tolerated. Desaturated to 85% on RA today. Will likely need oxygen at d/c  Pneumonia:  repeat CXR today shows worsening pneumonia. D/c rocephin and start unasyn and continue on azithromycin. Continue on bronchodilators and encourage incentive spirometry   Right pleural effusion: moderate as per CT. S/p thoracentesis w/ 586m of pleural fluid removed.  Pleural fluid NGTD  Multiple left rib fractures: as per CXR 12/25/21 but likely remote fractures. Possibly secondary previous trauma prior to admission. Encourage incentive spirometry. Oxycodone prn   Right lung nodule: incidental finding on CT chest. Will need repeat CT as an outpatient in several weeks. Pt is aware and verbalized his understanding   Thrombocytosis: labile, likely reactive. Will continue to monitor   Hx of CAD: continue on metoprolol, lisinopril   HTN: continue on lisinopril, metoprolol  Leukocytosis: likely secondary to infection. Continue on abxs    OSA: does not use CPAP   HLD: continue on statin   Obesity: BMI 396.2Complicates overall care & prognosis    DVT prophylaxis: lovenox  Code Status: full  Family Communication:  Disposition Plan: likely d/c back home   Level of care: Telemetry Medical  Status is: Inpatient  Remains inpatient appropriate because: severity of illness, worsening pneumonia per  CXR but pt's wants a second opinion on treatment and refusing change in abxs today. Also pt was upset about having to give to put a credit card down for home oxygen use. See CM's note for more information. Another hospitalist will take over pt's care tomorrow for his second opinion     Consultants:    Procedures:  Antimicrobials: azithromycin, unasyn    Subjective: Pt c/o fatigue and improved chest wall pain   Objective: Vitals:   12/27/21 2059 12/27/21 2110 12/28/21 0548 12/28/21 0853  BP: (!) 152/89  121/79 130/84  Pulse: (!) 113 (!) 111 (!) 104 (!) 102  Resp: 20  20 20   Temp: 99.5 F (37.5 C)  98.7 F (37.1 C) 98.7 F (37.1 C)  TempSrc:   Oral Oral  SpO2: 95% 98% 94% 96%  Weight:      Height:        Intake/Output Summary (Last 24 hours) at 12/28/2021 1604 Last data filed at 12/28/2021 [id number] per 24 hour  Intake 240 ml  Output --  Net 240 ml   Filed Weights   12/24/21 1343 12/24/21 2201  Weight: 99.8 kg 108 kg    Examination:  General exam: Appears agitated & frustrated Respiratory system: decreased breath sounds b/l R>L   Cardiovascular system: S1/S2+. No rubs or clicks  Gastrointestinal system: Abd is soft, NT, obese & normal bowel sounds Central nervous system: alert and oriented. Moves all extremities  Psychiatry: judgement and insight appears poor. Frustrated and agitated mood & affect     Data Reviewed: I have personally reviewed following labs and imaging studies  CBC: Recent Labs  Lab 12/24/21  1615 12/25/21 0516 12/26/21 0509 12/27/21 0718 12/28/21 0510  WBC 13.0* 16.0* 17.4* 14.9* 14.5*  NEUTROABS 11.7*  --   --   --   --   HGB 14.8 13.3 13.0 13.4 13.3  HCT 46.2 42.8 42.0 42.5 41.9  MCV 86.2 87.0 88.4 88.0 84.5  PLT 414* 405* 395 403* 197*   Basic Metabolic Panel: Recent Labs  Lab 12/24/21 1615 12/24/21 1908 12/25/21 0516 12/26/21 0509 12/27/21 0718 12/28/21 0510  NA 139  --  136 135 135 135  K 4.1  --  4.3 4.3 4.2 4.5  CL  100  --  101 100 97* 96*  CO2 29  --  28 29 28 29   GLUCOSE 119*  --  109* 103* 127* 128*  BUN 13  --  13 17 13 14   CREATININE 0.83  --  0.85 0.90 0.78 0.96  CALCIUM 9.4  --  8.7* 8.8* 8.6* 9.0  MG  --  1.9  --   --   --   --   PHOS  --  3.1  --   --   --   --    GFR: Estimated Creatinine Clearance: 96.6 mL/min (by C-G formula based on SCr of 0.96 mg/dL). Liver Function Tests: No results for input(s): AST, ALT, ALKPHOS, BILITOT, PROT, ALBUMIN in the last 168 hours. No results for input(s): LIPASE, AMYLASE in the last 168 hours. No results for input(s): AMMONIA in the last 168 hours. Coagulation Profile: Recent Labs  Lab 12/24/21 1615  INR 1.0   Cardiac Enzymes: No results for input(s): CKTOTAL, CKMB, CKMBINDEX, TROPONINI in the last 168 hours. BNP (last 3 results) No results for input(s): PROBNP in the last 8760 hours. HbA1C: No results for input(s): HGBA1C in the last 72 hours. CBG: No results for input(s): GLUCAP in the last 168 hours. Lipid Profile: No results for input(s): CHOL, HDL, LDLCALC, TRIG, CHOLHDL, LDLDIRECT in the last 72 hours. Thyroid Function Tests: No results for input(s): TSH, T4TOTAL, FREET4, T3FREE, THYROIDAB in the last 72 hours. Anemia Panel: No results for input(s): VITAMINB12, FOLATE, FERRITIN, TIBC, IRON, RETICCTPCT in the last 72 hours. Sepsis Labs: Recent Labs  Lab 12/24/21 1615 12/24/21 1906 12/24/21 2139  PROCALCITON <0.10  --   --   LATICACIDVEN  --  1.7 1.1    Recent Results (from the past 240 hour(s))  Resp Panel by RT-PCR (Flu A&B, Covid) Nasopharyngeal Swab     Status: None   Collection Time: 12/24/21  7:08 PM   Specimen: Nasopharyngeal Swab; Nasopharyngeal(NP) swabs in vial transport medium  Result Value Ref Range Status   SARS Coronavirus 2 by RT PCR NEGATIVE NEGATIVE Final    Comment: (NOTE) SARS-CoV-2 target nucleic acids are NOT DETECTED.  The SARS-CoV-2 RNA is generally detectable in upper respiratory specimens during the  acute phase of infection. The lowest concentration of SARS-CoV-2 viral copies this assay can detect is 138 copies/mL. A negative result does not preclude SARS-Cov-2 infection and should not be used as the sole basis for treatment or other patient management decisions. A negative result may occur with  improper specimen collection/handling, submission of specimen other than nasopharyngeal swab, presence of viral mutation(s) within the areas targeted by this assay, and inadequate number of viral copies(<138 copies/mL). A negative result must be combined with clinical observations, patient history, and epidemiological information. The expected result is Negative.  Fact Sheet for Patients:  EntrepreneurPulse.com.au  Fact Sheet for Healthcare Providers:  IncredibleEmployment.be  This test is no  t yet approved or cleared by the [place] and  has been authorized for detection and/or diagnosis of SARS-CoV-2 by FDA under an Emergency Use Authorization (EUA). This EUA will remain  in effect (meaning this test can be used) for the duration of the COVID-19 declaration under Section 564(b)(1) of the Act, 21 U.S.C.section 360bbb-3(b)(1), unless the authorization is terminated  or revoked sooner.       Influenza A by PCR NEGATIVE NEGATIVE Final   Influenza B by PCR NEGATIVE NEGATIVE Final    Comment: (NOTE) The Xpert Xpress SARS-CoV-2/FLU/RSV plus assay is intended as an aid in the diagnosis of influenza from Nasopharyngeal swab specimens and should not be used as a sole basis for treatment. Nasal washings and aspirates are unacceptable for Xpert Xpress SARS-CoV-2/FLU/RSV testing.  Fact Sheet for Patients: EntrepreneurPulse.com.au  Fact Sheet for Healthcare Providers: IncredibleEmployment.be  This test is not yet approved or cleared by the [place] FDA and has been authorized for detection and/or  diagnosis of SARS-CoV-2 by FDA under an Emergency Use Authorization (EUA). This EUA will remain in effect (meaning this test can be used) for the duration of the COVID-19 declaration under Section 564(b)(1) of the Act, 21 U.S.C. section 360bbb-3(b)(1), unless the authorization is terminated or revoked.  Performed at [facility], [address], [place], [place] [address]   Blood Culture (routine x 2)     Status: None (Preliminary result)   Collection Time: 12/24/21  7:08 PM   Specimen: BLOOD  Result Value Ref Range Status   Specimen Description BLOOD RAC  Final   Special Requests BOTTLES DRAWN AEROBIC AND ANAEROBIC BCAV  Final   Culture   Final    NO GROWTH 4 DAYS Performed at [facility], [address], [place], [place] [address]    Report Status PENDING  Incomplete  Blood Culture (routine x 2)     Status: None (Preliminary result)   Collection Time: 12/24/21  9:39 PM   Specimen: BLOOD  Result Value Ref Range Status   Specimen Description BLOOD RIGHT HAND  Final   Special Requests   Final    BOTTLES DRAWN AEROBIC AND ANAEROBIC Blood Culture adequate volume   Culture   Final    NO GROWTH 4 DAYS Performed at [facility], [address], [place], [place] [address]    Report Status PENDING  Incomplete  Body fluid culture w Gram Stain     Status: None   Collection Time: 12/25/21 11:57 AM   Specimen: PATH Cytology Pleural fluid  Result Value Ref Range Status   Specimen Description   Final    PLEURAL Performed at [facility], [address], [place], [place] [address]    Special Requests   Final    NONE Performed at [facility], [address] [place], [place] [address]    Gram Stain   Final    MODERATE WBC PRESENT,BOTH PMN AND MONONUCLEAR NO ORGANISMS SEEN    Culture   Final    NO GROWTH 3 DAYS Performed at [facility], [place] [address] [place], [place] [address]    Report Status 12/28/2021 FINAL  Final          Radiology Studies: DG Chest Port 1 View  Result Date: 12/28/2021 CLINICAL DATA:  Right rib pain after injury. EXAM: PORTABLE CHEST 1 VIEW COMPARISON:  December 25, 2021. FINDINGS: The heart size and mediastinal contours are within normal limits. Left lung is clear. No  pneumothorax is noted. Increased diffuse lung opacity is noted concerning for pneumonia or atelectasis and associated pleural effusion. Old left rib fractures are noted. IMPRESSION: Increased right lung opacity is noted concerning for worsening pneumonia or atelectasis with associated pleural effusion. No pneumothorax is noted. Electronically Signed   By: Marijo Conception M.D.   On: 12/28/2021 10:53        Scheduled Meds:  azithromycin  500 mg Oral Daily   enoxaparin (LOVENOX) injection  0.5 mg/kg Subcutaneous Q24H   lisinopril  10 mg Oral QHS   metoprolol succinate  12.5 mg Oral QHS   pravastatin  40 mg Oral QHS   cyanocobalamin  1,000 mcg Oral Daily   Continuous Infusions:  sodium chloride 10 mL/hr at 12/24/21 2041   ampicillin-sulbactam (UNASYN) IV 1.5 g (12/28/21 1602)     LOS: 3 days    Time spent: 15 mins     Wyvonnia Dusky, MD [facility] Pager [phone]  If 7PM-7AM, please contact night-coverage 12/28/2021, 4:04 PM

## 2021-12-28 NOTE — Progress Notes (Signed)
Per Dr. Mayford Knife, patient with worsening pneumonia and needing change in IV antibiotics. When I went to relay this information to the patient, he became agitated and irritable stating that he would like a second opinion and a different doctor assigned to him during this hospitalization. I let him know I would relay his request to the attending provider Dr. Mayford Knife.   When I went in the room to administer new antibiotics, ampicillin, Mr. Delisle stated that he no longer wanted any antibiotics, he removed his nasal cannula and stated he did not need it and that the doctor was wrong about his pneumonia and that his sleep apnea was what was causing his low oxygen levels. He stated that he wanted the second opinion and a new doctor.I notified Dr. Mayford Knife of his refusal of medications and oxygen and his request for a second opinion. She stated that was his choice to leave, but that it would be against her medical advice.   I stated that if he no longer wanted to seek care at this this hospital that he was free to go, but per Dr. Mayford Knife he would be leaving against medical advice and not being discharged. I relayed to him the message from Dr. Mayford Knife that no other hospitalist provider would be rounding on him today and that he could likely seek a second opinion tomorrow.   I notified the unit Charge Nurse Durenda Hurt. of the above, she was in the room during part of the conversation with the patient.  I also notified the Administrative Coordinator Estrella Myrtle. RN who came to bedside  well of the above information. She came to bedside to speak to patient and he is now agreeable to take ordered IV antibiotics and have nasal cannula on.

## 2021-12-28 NOTE — Progress Notes (Signed)
PROGRESS NOTE    Sean Durham  TMA:[id number] DOB: 08/25/1961 DOA: 12/24/2021 PCP: Jonetta Osgood, NP    Assessment & Plan:   Principal Problem:   Pleural effusion on right Active Problems:   Primary hypertension   Hyperlipidemia, mixed   Sepsis ([place])   Leukocytosis   OSA (obstructive sleep apnea)   CAD S/P percutaneous coronary angioplasty   History of COVID-19   Sepsis: met criteria w/ tachypnea, tachycardia, leukocytosis & possible pneumonia. Continue on IV rocephin, azithromycin, bronchodilators and encourage incentive spirometry. Continue on supplemental oxygen and wean as tolerated. Desaturated to 88% on RA w/ exertion only.   Pneumonia: continue on IV rocephin, azithromycin, bronchodilators. Encourage incentive spirometry   Right pleural effusion: moderate as per CT. S/p thoracentesis w/ 541m of pleural fluid removed.  Pleural fluid NGTD   Multiple rib fractures: as per CXR 12/25/21 but likely remote fractures. Possibly secondary previous trauma prior to admission. Encourage incentive spirometry. Toradol prn  Right lung nodule: incidental finding on CT chest. Will need repeat CT as an outpatient. Pt is aware and verbalized his understanding   Thrombocytosis: labile. Will continue to monitor   Hx of CAD: continue on BB, ACE-I   HTN: continue on lisinopril, metoprolol  Leukocytosis: likely secondary to infection. Continue on abxs   OSA: does not use CPAP   HLD: continue on statin    Obesity: BMI 35.1. Complicates overall care & prognosis    DVT prophylaxis: lovenox  Code Status: full  Family Communication: discussed pt's care w/ pt's family and answered their questions  Disposition Plan: likely d/c back home   Level of care: Telemetry Medical  Status is: Inpatient  Remains inpatient appropriate because: severity of illness, can likely d/c in 24 hrs      Consultants:    Procedures:  Antimicrobials: azithromycin, rocephin     Subjective: Pt c/o fatigue and improved chest wall pain   Objective: Vitals:   12/27/21 2059 12/27/21 2110 12/28/21 0548 12/28/21 0853  BP: (!) 152/89  121/79 130/84  Pulse: (!) 113 (!) 111 (!) 104 (!) 102  Resp: 20  20 20   Temp: 99.5 F (37.5 C)  98.7 F (37.1 C) 98.7 F (37.1 C)  TempSrc:   Oral Oral  SpO2: 95% 98% 94% 96%  Weight:      Height:        Intake/Output Summary (Last 24 hours) at 12/28/2021 1605 Last data filed at 12/28/2021 [id number] per 24 hour  Intake 240 ml  Output --  Net 240 ml    Filed Weights   12/24/21 1343 12/24/21 2201  Weight: 99.8 kg 108 kg    Examination:  General exam: Appears calm & comfortable  Respiratory system: diminished breath sounds b/l  Cardiovascular system: S1 & S2+. No rubs or clicks   Gastrointestinal system: Abd is soft, NT, obese & hypoactive bowel sounds  Central nervous system: alert and oriented. Moves all extremities  Psychiatry: Judgement and insight appears normal. Flat mood and affect     Data Reviewed: I have personally reviewed following labs and imaging studies  CBC: Recent Labs  Lab 12/24/21 1615 12/25/21 0516 12/26/21 0509 12/27/21 0718 12/28/21 0510  WBC 13.0* 16.0* 17.4* 14.9* 14.5*  NEUTROABS 11.7*  --   --   --   --   HGB 14.8 13.3 13.0 13.4 13.3  HCT 46.2 42.8 42.0 42.5 41.9  MCV 86.2 87.0 88.4 88.0 84.5  PLT 414* 405* 395 403* 430*    Basic Metabolic  Panel: Recent Labs  Lab 12/24/21 1615 12/24/21 1908 12/25/21 0516 12/26/21 0509 12/27/21 0718 12/28/21 0510  NA 139  --  136 135 135 135  K 4.1  --  4.3 4.3 4.2 4.5  CL 100  --  101 100 97* 96*  CO2 29  --  28 29 28 29   GLUCOSE 119*  --  109* 103* 127* 128*  BUN 13  --  13 17 13 14   CREATININE 0.83  --  0.85 0.90 0.78 0.96  CALCIUM 9.4  --  8.7* 8.8* 8.6* 9.0  MG  --  1.9  --   --   --   --   PHOS  --  3.1  --   --   --   --     GFR: Estimated Creatinine Clearance: 96.6 mL/min (by C-G formula based on SCr of 0.96  mg/dL). Liver Function Tests: No results for input(s): AST, ALT, ALKPHOS, BILITOT, PROT, ALBUMIN in the last 168 hours. No results for input(s): LIPASE, AMYLASE in the last 168 hours. No results for input(s): AMMONIA in the last 168 hours. Coagulation Profile: Recent Labs  Lab 12/24/21 1615  INR 1.0    Cardiac Enzymes: No results for input(s): CKTOTAL, CKMB, CKMBINDEX, TROPONINI in the last 168 hours. BNP (last 3 results) No results for input(s): PROBNP in the last 8760 hours. HbA1C: No results for input(s): HGBA1C in the last 72 hours. CBG: No results for input(s): GLUCAP in the last 168 hours. Lipid Profile: No results for input(s): CHOL, HDL, LDLCALC, TRIG, CHOLHDL, LDLDIRECT in the last 72 hours. Thyroid Function Tests: No results for input(s): TSH, T4TOTAL, FREET4, T3FREE, THYROIDAB in the last 72 hours. Anemia Panel: No results for input(s): VITAMINB12, FOLATE, FERRITIN, TIBC, IRON, RETICCTPCT in the last 72 hours. Sepsis Labs: Recent Labs  Lab 12/24/21 1615 12/24/21 1906 12/24/21 2139  PROCALCITON <0.10  --   --   LATICACIDVEN  --  1.7 1.1     Recent Results (from the past 240 hour(s))  Resp Panel by RT-PCR (Flu A&B, Covid) Nasopharyngeal Swab     Status: None   Collection Time: 12/24/21  7:08 PM   Specimen: Nasopharyngeal Swab; Nasopharyngeal(NP) swabs in vial transport medium  Result Value Ref Range Status   SARS Coronavirus 2 by RT PCR NEGATIVE NEGATIVE Final    Comment: (NOTE) SARS-CoV-2 target nucleic acids are NOT DETECTED.  The SARS-CoV-2 RNA is generally detectable in upper respiratory specimens during the acute phase of infection. The lowest concentration of SARS-CoV-2 viral copies this assay can detect is 138 copies/mL. A negative result does not preclude SARS-Cov-2 infection and should not be used as the sole basis for treatment or other patient management decisions. A negative result may occur with  improper specimen collection/handling,  submission of specimen other than nasopharyngeal swab, presence of viral mutation(s) within the areas targeted by this assay, and inadequate number of viral copies(<138 copies/mL). A negative result must be combined with clinical observations, patient history, and epidemiological information. The expected result is Negative.  Fact Sheet for Patients:  EntrepreneurPulse.com.au  Fact Sheet for Healthcare Providers:  IncredibleEmployment.be  This test is no t yet approved or cleared by the [place] FDA and  has been authorized for detection and/or diagnosis of SARS-CoV-2 by FDA under an Emergency Use Authorization (EUA). This EUA will remain  in effect (meaning this test can be used) for the duration of the COVID-19 declaration under Section 564(b)(1) of the Act, 21 U.S.C.section 360bbb-3(b)(1), unless the  authorization is terminated  or revoked sooner.       Influenza A by PCR NEGATIVE NEGATIVE Final   Influenza B by PCR NEGATIVE NEGATIVE Final    Comment: (NOTE) The Xpert Xpress SARS-CoV-2/FLU/RSV plus assay is intended as an aid in the diagnosis of influenza from Nasopharyngeal swab specimens and should not be used as a sole basis for treatment. Nasal washings and aspirates are unacceptable for Xpert Xpress SARS-CoV-2/FLU/RSV testing.  Fact Sheet for Patients: EntrepreneurPulse.com.au  Fact Sheet for Healthcare Providers: IncredibleEmployment.be  This test is not yet approved or cleared by the [place] FDA and has been authorized for detection and/or diagnosis of SARS-CoV-2 by FDA under an Emergency Use Authorization (EUA). This EUA will remain in effect (meaning this test can be used) for the duration of the COVID-19 declaration under Section 564(b)(1) of the Act, 21 U.S.C. section 360bbb-3(b)(1), unless the authorization is terminated or revoked.  Performed at [facility], [address], [place], [place] [address]   Blood Culture (routine x 2)     Status: None (Preliminary result)   Collection Time: 12/24/21  7:08 PM   Specimen: BLOOD  Result Value Ref Range Status   Specimen Description BLOOD RAC  Final   Special Requests BOTTLES DRAWN AEROBIC AND ANAEROBIC BCAV  Final   Culture   Final    NO GROWTH 4 DAYS Performed at [facility], [address], [place], [place] [address]    Report Status PENDING  Incomplete  Blood Culture (routine x 2)     Status: None (Preliminary result)   Collection Time: 12/24/21  9:39 PM   Specimen: BLOOD  Result Value Ref Range Status   Specimen Description BLOOD RIGHT HAND  Final   Special Requests   Final    BOTTLES DRAWN AEROBIC AND ANAEROBIC Blood Culture adequate volume   Culture   Final    NO GROWTH 4 DAYS Performed at [facility], [address] [place], [place] [address]    Report Status PENDING  Incomplete  Body fluid culture w Gram Stain     Status: None   Collection Time: 12/25/21 11:57 AM   Specimen: PATH Cytology Pleural fluid  Result Value Ref Range Status   Specimen Description   Final    PLEURAL Performed at [facility], [address], [place], [place] [address]    Special Requests   Final    NONE Performed at [facility], [address] [place], [place] [address]    Gram Stain   Final    MODERATE WBC PRESENT,BOTH PMN AND MONONUCLEAR NO ORGANISMS SEEN    Culture   Final    NO GROWTH 3 DAYS Performed at [facility], [place] [address] [place], [place] [address]    Report Status 12/28/2021 FINAL  Final          Radiology Studies: DG Chest Port 1 View  Result Date: 12/28/2021 CLINICAL DATA:  Right rib pain after injury. EXAM: PORTABLE CHEST 1 VIEW COMPARISON:  December 25, 2021. FINDINGS: The heart size and mediastinal contours are within normal limits. Left lung is clear. No pneumothorax is noted. Increased diffuse lung opacity is noted  concerning for pneumonia or atelectasis and associated pleural effusion. Old left rib fractures are noted. IMPRESSION: Increased right lung opacity is noted concerning for worsening pneumonia or atelectasis with associated pleural effusion. No pneumothorax is noted. Electronically Signed   By: Marijo Conception M.D.   On: 12/28/2021  10:53        Scheduled Meds:  azithromycin  500 mg Oral Daily   enoxaparin (LOVENOX) injection  0.5 mg/kg Subcutaneous Q24H   lisinopril  10 mg Oral QHS   metoprolol succinate  12.5 mg Oral QHS   pravastatin  40 mg Oral QHS   cyanocobalamin  1,000 mcg Oral Daily   Continuous Infusions:  sodium chloride 10 mL/hr at 12/24/21 2041   ampicillin-sulbactam (UNASYN) IV 1.5 g (12/28/21 1602)     LOS: 3 days    Time spent: 15 mins     Wyvonnia Dusky, MD [facility] Pager [phone]  If 7PM-7AM, please contact night-coverage 12/28/2021, 4:05 PM

## 2021-12-29 ENCOUNTER — Ambulatory Visit: Payer: 59 | Admitting: Nurse Practitioner

## 2021-12-29 LAB — CULTURE, BLOOD (ROUTINE X 2)
Culture: NO GROWTH
Culture: NO GROWTH
Special Requests: ADEQUATE

## 2021-12-29 LAB — CBC
HCT: 41.6 % (ref 39.0–52.0)
Hemoglobin: 13 g/dL (ref 13.0–17.0)
MCH: 27.2 pg (ref 26.0–34.0)
MCHC: 31.3 g/dL (ref 30.0–36.0)
MCV: 87 fL (ref 80.0–100.0)
Platelets: 480 10*3/uL — ABNORMAL HIGH (ref 150–400)
RBC: 4.78 MIL/uL (ref 4.22–5.81)
RDW: 13.5 % (ref 11.5–15.5)
WBC: 13.8 10*3/uL — ABNORMAL HIGH (ref 4.0–10.5)
nRBC: 0 % (ref 0.0–0.2)

## 2021-12-29 LAB — BASIC METABOLIC PANEL
Anion gap: 11 (ref 5–15)
BUN: 15 mg/dL (ref 8–23)
CO2: 29 mmol/L (ref 22–32)
Calcium: 8.7 mg/dL — ABNORMAL LOW (ref 8.9–10.3)
Chloride: 97 mmol/L — ABNORMAL LOW (ref 98–111)
Creatinine, Ser: 0.83 mg/dL (ref 0.61–1.24)
GFR, Estimated: >60 mL/min (ref >60)
Glucose, Bld: 109 mg/dL — ABNORMAL HIGH (ref 70–99)
Potassium: 4.3 mmol/L (ref 3.5–5.1)
Sodium: 137 mmol/L (ref 135–145)

## 2021-12-29 LAB — PROCALCITONIN: Procalcitonin: 0.33 ng/mL

## 2021-12-29 MED ORDER — ALBUTEROL SULFATE HFA 108 (90 BASE) MCG/ACT IN AERS: 2.0000 | INHALATION_SPRAY | Freq: Four times a day (QID) | RESPIRATORY_TRACT | 0 refills | Status: DC | PRN

## 2021-12-29 MED ORDER — BENZONATATE 100 MG PO CAPS: 100.0000 mg | ORAL_CAPSULE | Freq: Three times a day (TID) | ORAL | 0 refills | Status: AC | PRN

## 2021-12-29 MED ORDER — AMOXICILLIN-POT CLAVULANATE 875-125 MG PO TABS
1.0000 | ORAL_TABLET | Freq: Two times a day (BID) | ORAL | Status: DC
  Administered 2021-12-29: 1 via ORAL
  Filled 2021-12-29: qty 1

## 2021-12-29 MED ORDER — POLYVINYL ALCOHOL 1.4 % OP SOLN
1.0000 [drp] | OPHTHALMIC | Status: DC | PRN
  Filled 2021-12-29: qty 15

## 2021-12-29 MED ORDER — AMOXICILLIN-POT CLAVULANATE 875-125 MG PO TABS: 1.0000 | ORAL_TABLET | Freq: Two times a day (BID) | ORAL | 0 refills | Status: AC

## 2021-12-29 NOTE — Progress Notes (Signed)
Sean Durham to be D/C'd Home per MD order.  Discussed prescriptions and follow up appointments with the patient. Prescriptions given to patient, medication list explained in detail. Pt verbalized understanding.  Allergies as of 12/29/2021       Reactions   Aspirin    Other reaction(s): Other (See Comments), Unknown bruising bruising   Hydrochlorothiazide W-triamterene Swelling   Nicotine    Mouth ulcers   Statins Hives        Medication List     STOP taking these medications    pravastatin 40 MG tablet Commonly known as: PRAVACHOL   tadalafil 20 MG tablet Commonly known as: CIALIS       TAKE these medications    acetaminophen 325 MG tablet Commonly known as: TYLENOL Take 2 tablets (650 mg total) by mouth every 6 (six) hours as needed for mild pain, fever or headache (or Fever >/= 101).   albuterol 108 (90 Base) MCG/ACT inhaler Commonly known as: VENTOLIN HFA Inhale 2 puffs into the lungs every 6 (six) hours as needed for wheezing or shortness of breath.   amoxicillin-clavulanate 875-125 MG tablet Commonly known as: Augmentin Take 1 tablet by mouth 2 (two) times daily for 7 days.   Ascorbic Acid 500 MG Chew Chew 1 tablet by mouth daily.   benzonatate 100 MG capsule Commonly known as: Tessalon Perles Take 1 capsule (100 mg total) by mouth 3 (three) times daily as needed for cough.   Cholecalciferol 50 MCG (2000 UT) Tabs Take 1 tablet by mouth daily.   cyanocobalamin 1000 MCG tablet Take 1,000 mcg by mouth daily.   lisinopril 10 MG tablet Commonly known as: ZESTRIL Take 1 tablet by mouth daily.   Magnesium Gluconate 550 MG Tabs Take 1 tablet by mouth daily.   metoprolol succinate 25 MG 24 hr tablet Commonly known as: TOPROL-XL Take 12.5 mg by mouth at bedtime.   oxyCODONE 5 MG immediate release tablet Commonly known as: Oxy IR/ROXICODONE Take 1 tablet (5 mg total) by mouth every 6 (six) hours as needed for up to 3 days for moderate pain or severe  pain.   zinc gluconate 50 MG tablet Take 1 tablet by mouth daily.               Durable Medical Equipment  (From admission, onward)           Start     Ordered   12/27/21 1142  For home use only DME oxygen  Once       Question Answer Comment  Length of Need 6 Months   Mode or (Route) Nasal cannula   Liters per Minute 2   Frequency Continuous (stationary and portable oxygen unit needed)   Oxygen conserving device Yes   Oxygen delivery system Gas      12/27/21 1141            Vitals:   12/29/21 0349 12/29/21 0443  BP: 135/82 (!) 149/88  Pulse: (!) 101 (!) 105  Resp: 18 16  Temp: 97.8 F (36.6 C) 98.6 F (37 C)  SpO2: 95% 95%    Skin clean, dry and intact without evidence of skin break down, no evidence of skin tears noted. IV catheter discontinued intact. Site without signs and symptoms of complications. Dressing and pressure applied. Pt denies pain at this time. No complaints noted.  An After Visit Summary was printed and given to the patient. Patient escorted via WC, and D/C home via private auto.  Sean Durham C.  Sean Durham

## 2021-12-29 NOTE — TOC Transition Note (Signed)
Transition of Care [facility]) - CM/SW Discharge Note   Patient Details  Name: Sean Durham MRN: [id number] Date of Birth: 05-08-1961  Transition of Care [facility]) CM/SW Contact:  Margarito Liner, LCSW Phone Number: 12/29/2021, 11:47 AM   Clinical Narrative: Patient has orders to discharge home today. [employer] has delivered oxygen to the room. No further concerns. CSW signing off.    Final next level of care: Home/Self Care Barriers to Discharge: Barriers Resolved   Patient Goals and CMS Choice        Discharge Placement                    Patient and family notified of of transfer: 12/29/21  Discharge Plan and Services                DME Arranged: Oxygen DME Agency: [employer] Date DME Agency Contacted: 12/29/21 Time DME Agency Contacted: 1134 Representative spoke with at DME Agency: Collet[place] HH Arranged: NA HH Agency: NA        Social Determinants of Health (SDOH) Interventions     Readmission Risk Interventions No flowsheet data found.

## 2021-12-29 NOTE — Discharge Summary (Signed)
Physician Discharge Summary  Sean Durham ZOX:[id number] DOB: [birth year] DOA: 12/24/2021  PCP: Jonetta Osgood, NP  Admit date: 12/24/2021 Discharge date: 12/29/2021  Admitted From: Home Disposition: Home  Recommendations for Outpatient Follow-up:  Follow up with PCP in 1-2 weeks   Home Health: No Equipment/Devices: Oxygen 2 L  Discharge Condition: Stable CODE STATUS: Full Diet recommendation: Regular  Brief/Interim Summary: 61 year old male with history of peyronie's disease, primary hypertension, mixed hyperlipidemia, who presents to the emergency department for chief concerns of cough and pain on the right side of his chest.   Initial vitals in the emergency department showed temperature of 98.7, respiration rate 24, heart rate of 113, initial blood pressure 157/98, SPO2 of 96% on room air.  Serum sodium in the emergency department was 139, potassium 4.1, chloride 100, bicarb 29, BUN 13, serum creatinine of 0.83, nonfasting blood glucose 119, GFR greater than 60.  WBC was elevated at 13, hemoglobin 14.8, platelets of 414.    Patient was treated for community-acquired pneumonia.  Initial thoracentesis was moderately successful.  500 cc fluid removed.  No cultures to date.  Was more fluid there however patient was unable to tolerate procedure due to pain.  Was ambulated prior to discharge.  Initially desaturated to 88% and home oxygen was ordered.  Subsequent ambulatory desaturation was worse and patient desaturated down to 85%.  Repeat chest x-ray and antibiotics were changed.  Chest x-ray showed worsening of pneumonia.  After change in antibiotics patient did demonstrate clinical improvement.  Still coughing at time of discharge but hemodynamically stable.  Lengthy conversation regarding discharge plan.  Patient states that he is more comfortable at home and does not wish to be in the hospital much longer due to potential exposure to respiratory pathogens.  I explained the reason for  continued hospitalization and decision was made to transition to p.o. Augmentin and discharge patient home.  Clear return to ED instructions were provided.  Prescription for pain medication and albuterol MDI inhaler also prescribed.  Patient will follow up with PCP and cardiology post discharge.    Discharge Diagnoses:  Principal Problem:   Pleural effusion on right Active Problems:   Primary hypertension   Hyperlipidemia, mixed   Sepsis (HCC)   Leukocytosis   OSA (obstructive sleep apnea)   CAD S/P percutaneous coronary angioplasty   History of COVID-19  Sepsis: met criteria w/ tachypnea, tachycardia, leukocytosis & possible pneumonia.  Initially on Rocephin.  This was discontinued after worsening of chest x-ray.  Unasyn was initiated with good result.  Oxygen saturations did improve.  Transition to Augmentin preparation for discharge.  Additional 7-day course prescribed   Pneumonia: See above   Right pleural effusion: moderate as per CT. S/p thoracentesis w/ 584m of pleural fluid removed.  Mild evidence of effusion noted on repeat chest x-ray.  Patient saturating well.  Hemodynamically stable.  Treated with oral antibiotics.  Clear return to ED instructions provided   Multiple left rib fractures: as per CXR 12/25/21 but likely remote fractures. Possibly secondary previous trauma prior to admission. Encourage incentive spirometry. Oxycodone prn    Right lung nodule: incidental finding on CT chest. Will need repeat CT as an outpatient in several weeks. Pt is aware and verbalized his understanding    Thrombocytosis: labile, likely reactive.  Downtrending at time of discharge   Hx of CAD: continue on metoprolol, lisinopril    HTN: continue on lisinopril, metoprolol   Leukocytosis: likely secondary to infection.  Downtrending at time of discharge  OSA: does not use CPAP    HLD: continue on statin   Discharge Instructions  Discharge Instructions     Diet - low sodium heart  healthy   Complete by: As directed    Discharge instructions   Complete by: As directed    F/u w/ PCP in 1-2 weeks   Increase activity slowly   Complete by: As directed    Increase activity slowly   Complete by: As directed       Allergies as of 12/29/2021       Reactions   Aspirin    Other reaction(s): Other (See Comments), Unknown bruising bruising   Hydrochlorothiazide W-triamterene Swelling   Nicotine    Mouth ulcers   Statins Hives        Medication List     STOP taking these medications    pravastatin 40 MG tablet Commonly known as: PRAVACHOL   tadalafil 20 MG tablet Commonly known as: CIALIS       TAKE these medications    acetaminophen 325 MG tablet Commonly known as: TYLENOL Take 2 tablets (650 mg total) by mouth every 6 (six) hours as needed for mild pain, fever or headache (or Fever >/= 101).   albuterol 108 (90 Base) MCG/ACT inhaler Commonly known as: VENTOLIN HFA Inhale 2 puffs into the lungs every 6 (six) hours as needed for wheezing or shortness of breath.   amoxicillin-clavulanate 875-125 MG tablet Commonly known as: Augmentin Take 1 tablet by mouth 2 (two) times daily for 7 days.   Ascorbic Acid 500 MG Chew Chew 1 tablet by mouth daily.   benzonatate 100 MG capsule Commonly known as: Tessalon Perles Take 1 capsule (100 mg total) by mouth 3 (three) times daily as needed for cough.   Cholecalciferol 50 MCG (2000 UT) Tabs Take 1 tablet by mouth daily.   cyanocobalamin 1000 MCG tablet Take 1,000 mcg by mouth daily.   lisinopril 10 MG tablet Commonly known as: ZESTRIL Take 1 tablet by mouth daily.   Magnesium Gluconate 550 MG Tabs Take 1 tablet by mouth daily.   metoprolol succinate 25 MG 24 hr tablet Commonly known as: TOPROL-XL Take 12.5 mg by mouth at bedtime.   oxyCODONE 5 MG immediate release tablet Commonly known as: Oxy IR/ROXICODONE Take 1 tablet (5 mg total) by mouth every 6 (six) hours as needed for up to 3 days for  moderate pain or severe pain.   zinc gluconate 50 MG tablet Take 1 tablet by mouth daily.               Durable Medical Equipment  (From admission, onward)           Start     Ordered   12/27/21 1142  For home use only DME oxygen  Once       Question Answer Comment  Length of Need 6 Months   Mode or (Route) Nasal cannula   Liters per Minute 2   Frequency Continuous (stationary and portable oxygen unit needed)   Oxygen conserving device Yes   Oxygen delivery system Gas      12/27/21 1141            Allergies  Allergen Reactions   Aspirin     Other reaction(s): Other (See Comments), Unknown bruising bruising    Hydrochlorothiazide W-Triamterene Swelling   Nicotine     Mouth ulcers   Statins Hives    Consultations: None   Procedures/Studies: DG Chest 2 View  Result Date:  12/24/2021 CLINICAL DATA:  Right rib pain after injury. EXAM: CHEST - 2 VIEW COMPARISON:  June 25, 2020. FINDINGS: Stable cardiomediastinal silhouette. Mild right basilar atelectasis or infiltrate is noted with small right pleural effusion. Multiple old left rib fractures are noted. IMPRESSION: Mild right basilar atelectasis or infiltrate with small right pleural effusion. Electronically Signed   By: Marijo Conception M.D.   On: 12/24/2021 14:52   CT Chest W Contrast  Addendum Date: 12/24/2021   ADDENDUM REPORT: 12/24/2021 17:48 ADDENDUM: This addendum is created to correct a voice recognition error in the clinical data. Clinical data should read as follows: Blunt chest trauma. Right lateral rib and right upper quadrant pain. Pain onset after pulling on a wrench at work and feeling a pop. Electronically Signed   By: Keith Rake M.D.   On: 12/24/2021 17:48   Result Date: 12/24/2021 CLINICAL DATA:  Blunt chest trauma. Right lateral ribbon right upper quadrant pain. Pain onset after pulling on a wrench at work and feeling a pop. EXAM: CT CHEST WITH CONTRAST TECHNIQUE: Multidetector CT  imaging of the chest was performed during intravenous contrast administration. RADIATION DOSE REDUCTION: This exam was performed according to the departmental dose-optimization program which includes automated exposure control, adjustment of the mA and/or kV according to patient size and/or use of iterative reconstruction technique. CONTRAST:  15m OMNIPAQUE IOHEXOL 300 MG/ML  SOLN COMPARISON:  Radiograph earlier today.  Chest CT 06/25/2020 FINDINGS: Cardiovascular: Mild atherosclerosis of the thoracic aorta. No acute aortic injury or acute aortic findings. The heart is normal in size. Coronary artery calcification versus stent. No pericardial effusion. Mediastinum/Nodes: No mediastinal hemorrhage or hematoma. Small scattered mediastinal lymph nodes are not enlarged by size criteria. Probable 14 mm right hilar node, series 2, image 79. No pneumomediastinum. No esophageal wall thickening. Lungs/Pleura: Moderate-sized right pleural effusion. Pleural fluid measures simple density. There is adjacent compressive atelectasis in the right lower lobe. Rounded subpleural density spanning the fissure in the anterior right lung base measures 2.6 x 2.4 cm, not seen on prior exam. This extends to the pleural surface with mild adjacent fissural and pleural thickening. Trace fluid tracks in the right major fissure. No pneumothorax. Punctate perifissural nodule in the left lower lobe, series 3, image 58, stable from prior exam. Unchanged focus of fissural thickening in the anterior right lung base series 3, image 70. Minor atelectasis in the dependent left lower lobe. No left pleural effusion. Upper Abdomen: Scattered low-density liver lesions which are not significantly changed from prior exam. Gallstone. No upper abdominal free fluid. Musculoskeletal: No acute rib fracture or acute osseous abnormality. No focal bone lesions. Remote left rib fractures are again seen. Slight loss of height of T8 superior endplate is chronic and  unchanged from prior no chest wall soft tissue abnormality. IMPRESSION: 1. Moderate-sized right pleural effusion with adjacent compressive atelectasis in the right lower lobe. Pleural fluid measures simple density. 2. Rounded subpleural density spanning the fissure in the anterior right lung base measuring 2.6 x 2.4 cm, not seen on prior exam. This extends to the pleural surface with mild adjacent fissural and pleural thickening. Differential considerations include focal atelectasis, pneumonia, or neoplasm. Recommend close CT follow-up after a course of treatment to ensure resolution, consider pulmonary follow-up. 3. Probable 14 mm right hilar node, nonspecific. 4. Coronary artery calcification versus stent. 5. Incidental cholelithiasis. 6. Unchanged low-density liver lesions, likely cysts, but incompletely characterized. Aortic Atherosclerosis (ICD10-I70.0). Electronically Signed: By: MKeith RakeM.D. On: 12/24/2021 17:28  DG Chest Port 1 View  Result Date: 12/28/2021 CLINICAL DATA:  Right rib pain after injury. EXAM: PORTABLE CHEST 1 VIEW COMPARISON:  December 25, 2021. FINDINGS: The heart size and mediastinal contours are within normal limits. Left lung is clear. No pneumothorax is noted. Increased diffuse lung opacity is noted concerning for pneumonia or atelectasis and associated pleural effusion. Old left rib fractures are noted. IMPRESSION: Increased right lung opacity is noted concerning for worsening pneumonia or atelectasis with associated pleural effusion. No pneumothorax is noted. Electronically Signed   By: Marijo Conception M.D.   On: 12/28/2021 10:53   DG Chest Port 1 View  Result Date: 12/25/2021 CLINICAL DATA:  Right-sided pleural effusion, status post thoracentesis. EXAM: PORTABLE CHEST 1 VIEW COMPARISON:  Yesterday FINDINGS: Multiple left rib fractures. Midline trachea. Mild cardiomegaly. Small right-sided pleural effusion is unchanged relative to yesterday's plain film. No  pneumothorax. No congestive failure. Right base airspace disease is slightly increased. IMPRESSION: No pneumothorax or other acute complication after thoracentesis. Persistent small right pleural effusion with increased adjacent airspace disease. Electronically Signed   By: Abigail Miyamoto M.D.   On: 12/25/2021 12:33   US THORACENTESIS ASP PLEURAL SPACE W/IMG GUIDE  Result Date: 12/25/2021 INDICATION: Patient presented to the ED with right-sided chest pain, blunt trauma at work, shortness of breath. Patient found to have a right pleural effusion. Interventional radiology asked to perform a diagnostic and therapeutic thoracentesis. EXAM: ULTRASOUND GUIDED THORACENTESIS MEDICATIONS: 1% lidocaine 10 mL COMPLICATIONS: None immediate. PROCEDURE: An ultrasound guided thoracentesis was thoroughly discussed with the patient and questions answered. The benefits, risks, alternatives and complications were also discussed. The patient understands and wishes to proceed with the procedure. Written consent was obtained. Ultrasound was performed to localize and mark an adequate pocket of fluid in the right chest. The area was then prepped and draped in the normal sterile fashion. 1% Lidocaine was used for local anesthesia. Under ultrasound guidance a 6 Fr Safe-T-Centesis catheter was introduced. Thoracentesis was performed. The procedure was stopped early due to patient pain/discomfort. The catheter was removed and a dressing applied. FINDINGS: A total of approximately 500 mL of amber-colored fluid was removed. Samples were sent to the laboratory as requested by the clinical team. IMPRESSION: Successful ultrasound guided right thoracentesis yielding 500 mL of pleural fluid. Read by: Soyla Dryer, NP Electronically Signed   By: Markus Daft M.D.   On: 12/25/2021 13:08      Subjective: Seen and examined on day of discharge.  Lengthy discussion regarding clinical status and reason for continued hospital admission.  Patient  expressed understanding of all discharge instructions.  Discharge Exam: Vitals:   12/29/21 0349 12/29/21 0443  BP: 135/82 (!) 149/88  Pulse: (!) 101 (!) 105  Resp: 18 16  Temp: 97.8 F (36.6 C) 98.6 F (37 C)  SpO2: 95% 95%   Vitals:   12/28/21 1709 12/28/21 1949 12/29/21 0349 12/29/21 0443  BP: (!) 144/86 (!) 152/76 135/82 (!) 149/88  Pulse: (!) 105 (!) 113 (!) 101 (!) 105  Resp: 20 20 18 16   Temp: 99.5 F (37.5 C) 99.6 F (37.6 C) 97.8 F (36.6 C) 98.6 F (37 C)  TempSrc: Oral Oral  Oral  SpO2: 100% 96% 95% 95%  Weight:      Height:        General: Pt is alert, awake, not in acute distress Cardiovascular: RRR, S1/S2 +, no rubs, no gallops Respiratory: Scattered crackles bilaterally.  Normal work of breathing.  2 L Abdominal:  Soft, NT, ND, bowel sounds + Extremities: no edema, no cyanosis    The results of significant diagnostics from this hospitalization (including imaging, microbiology, ancillary and laboratory) are listed below for reference.     Microbiology: Recent Results (from the past 240 hour(s))  Resp Panel by RT-PCR (Flu A&B, Covid) Nasopharyngeal Swab     Status: None   Collection Time: 12/24/21  7:08 PM   Specimen: Nasopharyngeal Swab; Nasopharyngeal(NP) swabs in vial transport medium  Result Value Ref Range Status   SARS Coronavirus 2 by RT PCR NEGATIVE NEGATIVE Final    Comment: (NOTE) SARS-CoV-2 target nucleic acids are NOT DETECTED.  The SARS-CoV-2 RNA is generally detectable in upper respiratory specimens during the acute phase of infection. The lowest concentration of SARS-CoV-2 viral copies this assay can detect is 138 copies/mL. A negative result does not preclude SARS-Cov-2 infection and should not be used as the sole basis for treatment or other patient management decisions. A negative result may occur with  improper specimen collection/handling, submission of specimen other than nasopharyngeal swab, presence of viral mutation(s) within  the areas targeted by this assay, and inadequate number of viral copies(<138 copies/mL). A negative result must be combined with clinical observations, patient history, and epidemiological information. The expected result is Negative.  Fact Sheet for Patients:  EntrepreneurPulse.com.au  Fact Sheet for Healthcare Providers:  IncredibleEmployment.be  This test is no t yet approved or cleared by the [place] FDA and  has been authorized for detection and/or diagnosis of SARS-CoV-2 by FDA under an Emergency Use Authorization (EUA). This EUA will remain  in effect (meaning this test can be used) for the duration of the COVID-19 declaration under Section 564(b)(1) of the Act, 21 U.S.C.section 360bbb-3(b)(1), unless the authorization is terminated  or revoked sooner.       Influenza A by PCR NEGATIVE NEGATIVE Final   Influenza B by PCR NEGATIVE NEGATIVE Final    Comment: (NOTE) The Xpert Xpress SARS-CoV-2/FLU/RSV plus assay is intended as an aid in the diagnosis of influenza from Nasopharyngeal swab specimens and should not be used as a sole basis for treatment. Nasal washings and aspirates are unacceptable for Xpert Xpress SARS-CoV-2/FLU/RSV testing.  Fact Sheet for Patients: EntrepreneurPulse.com.au  Fact Sheet for Healthcare Providers: IncredibleEmployment.be  This test is not yet approved or cleared by the [place] FDA and has been authorized for detection and/or diagnosis of SARS-CoV-2 by FDA under an Emergency Use Authorization (EUA). This EUA will remain in effect (meaning this test can be used) for the duration of the COVID-19 declaration under Section 564(b)(1) of the Act, 21 U.S.C. section 360bbb-3(b)(1), unless the authorization is terminated or revoked.  Performed at [facility], [address] [place], [place] [address]   Blood Culture (routine x 2)     Status: None    Collection Time: 12/24/21  7:08 PM   Specimen: BLOOD  Result Value Ref Range Status   Specimen Description BLOOD RAC  Final   Special Requests BOTTLES DRAWN AEROBIC AND ANAEROBIC BCAV  Final   Culture   Final    NO GROWTH 5 DAYS Performed at [facility], [address] [place], [place] [address]    Report Status 12/29/2021 FINAL  Final  Blood Culture (routine x 2)     Status: None   Collection Time: 12/24/21  9:39 PM   Specimen: BLOOD  Result Value Ref Range Status   Specimen Description BLOOD RIGHT HAND  Final   Special Requests  Final    BOTTLES DRAWN AEROBIC AND ANAEROBIC Blood Culture adequate volume   Culture   Final    NO GROWTH 5 DAYS Performed at [facility], [address] [place], [place] [address]    Report Status 12/29/2021 FINAL  Final  Body fluid culture w Gram Stain     Status: None   Collection Time: 12/25/21 11:57 AM   Specimen: PATH Cytology Pleural fluid  Result Value Ref Range Status   Specimen Description   Final    PLEURAL Performed at [facility], [address] [place] [address], [place], [place] [address]    Special Requests   Final    NONE Performed at [facility], [address], [place], [place] [address]    Gram Stain   Final    MODERATE WBC PRESENT,BOTH PMN AND MONONUCLEAR NO ORGANISMS SEEN    Culture   Final    NO GROWTH 3 DAYS Performed at [facility], [place] [address], [place], [place] [address]    Report Status 12/28/2021 FINAL  Final     Labs: BNP (last 3 results) Recent Labs    12/24/21 1615  BNP 9.8   Basic Metabolic Panel: Recent Labs  Lab 12/24/21 1908 12/25/21 0516 12/26/21 0509 12/27/21 0718 12/28/21 0510 12/29/21 0513  NA  --  136 135 135 135 137  K  --  4.3 4.3 4.2 4.5 4.3  CL  --  101 100 97* 96* 97*  CO2  --  28 29 28 29 29   GLUCOSE  --  109* 103* 127* 128* 109*  BUN  --  13 17 13 14 15   CREATININE  --  0.85 0.90 0.78 0.96 0.83  CALCIUM  --  8.7* 8.8* 8.6* 9.0 8.7*  MG  1.9  --   --   --   --   --   PHOS 3.1  --   --   --   --   --    Liver Function Tests: No results for input(s): AST, ALT, ALKPHOS, BILITOT, PROT, ALBUMIN in the last 168 hours. No results for input(s): LIPASE, AMYLASE in the last 168 hours. No results for input(s): AMMONIA in the last 168 hours. CBC: Recent Labs  Lab 12/24/21 1615 12/25/21 0516 12/26/21 0509 12/27/21 0718 12/28/21 0510 12/29/21 0513  WBC 13.0* 16.0* 17.4* 14.9* 14.5* 13.8*  NEUTROABS 11.7*  --   --   --   --   --   HGB 14.8 13.3 13.0 13.4 13.3 13.0  HCT 46.2 42.8 42.0 42.5 41.9 41.6  MCV 86.2 87.0 88.4 88.0 84.5 87.0  PLT 414* 405* 395 403* 430* 480*   Cardiac Enzymes: No results for input(s): CKTOTAL, CKMB, CKMBINDEX, TROPONINI in the last 168 hours. BNP: Invalid input(s): POCBNP CBG: No results for input(s): GLUCAP in the last 168 hours. D-Dimer No results for input(s): DDIMER in the last 72 hours. Hgb A1c No results for input(s): HGBA1C in the last 72 hours. Lipid Profile No results for input(s): CHOL, HDL, LDLCALC, TRIG, CHOLHDL, LDLDIRECT in the last 72 hours. Thyroid function studies No results for input(s): TSH, T4TOTAL, T3FREE, THYROIDAB in the last 72 hours.  Invalid input(s): FREET3 Anemia work up No results for input(s): VITAMINB12, FOLATE, FERRITIN, TIBC, IRON, RETICCTPCT in the last 72 hours. Urinalysis No results found for: COLORURINE, APPEARANCEUR, [place], [place], GLUCOSEU, [place], BILIRUBINUR, KETONESUR, PROTEINUR, UROBILINOGEN, NITRITE, LEUKOCYTESUR Sepsis Labs Invalid input(s): PROCALCITONIN,  WBC,  LACTICIDVEN Microbiology Recent Results (from the past 240 hour(s))  Resp Panel by RT-PCR (Flu A&B, Covid) Nasopharyngeal Swab     Status: None   Collection Time: 12/24/21  7:08 PM   Specimen: Nasopharyngeal Swab; Nasopharyngeal(NP) swabs in vial transport medium  Result Value Ref Range Status   SARS Coronavirus 2 by RT PCR NEGATIVE NEGATIVE Final    Comment: (NOTE) SARS-CoV-2  target nucleic acids are NOT DETECTED.  The SARS-CoV-2 RNA is generally detectable in upper respiratory specimens during the acute phase of infection. The lowest concentration of SARS-CoV-2 viral copies this assay can detect is 138 copies/mL. A negative result does not preclude SARS-Cov-2 infection and should not be used as the sole basis for treatment or other patient management decisions. A negative result may occur with  improper specimen collection/handling, submission of specimen other than nasopharyngeal swab, presence of viral mutation(s) within the areas targeted by this assay, and inadequate number of viral copies(<138 copies/mL). A negative result must be combined with clinical observations, patient history, and epidemiological information. The expected result is Negative.  Fact Sheet for Patients:  EntrepreneurPulse.com.au  Fact Sheet for Healthcare Providers:  IncredibleEmployment.be  This test is no t yet approved or cleared by the [place] FDA and  has been authorized for detection and/or diagnosis of SARS-CoV-2 by FDA under an Emergency Use Authorization (EUA). This EUA will remain  in effect (meaning this test can be used) for the duration of the COVID-19 declaration under Section 564(b)(1) of the Act, 21 U.S.C.section 360bbb-3(b)(1), unless the authorization is terminated  or revoked sooner.       Influenza A by PCR NEGATIVE NEGATIVE Final   Influenza B by PCR NEGATIVE NEGATIVE Final    Comment: (NOTE) The Xpert Xpress SARS-CoV-2/FLU/RSV plus assay is intended as an aid in the diagnosis of influenza from Nasopharyngeal swab specimens and should not be used as a sole basis for treatment. Nasal washings and aspirates are unacceptable for Xpert Xpress SARS-CoV-2/FLU/RSV testing.  Fact Sheet for Patients: EntrepreneurPulse.com.au  Fact Sheet for Healthcare  Providers: IncredibleEmployment.be  This test is not yet approved or cleared by the [place] FDA and has been authorized for detection and/or diagnosis of SARS-CoV-2 by FDA under an Emergency Use Authorization (EUA). This EUA will remain in effect (meaning this test can be used) for the duration of the COVID-19 declaration under Section 564(b)(1) of the Act, 21 U.S.C. section 360bbb-3(b)(1), unless the authorization is terminated or revoked.  Performed at [facility], [address] [place], [place] [address]   Blood Culture (routine x 2)     Status: None   Collection Time: 12/24/21  7:08 PM   Specimen: BLOOD  Result Value Ref Range Status   Specimen Description BLOOD RAC  Final   Special Requests BOTTLES DRAWN AEROBIC AND ANAEROBIC BCAV  Final   Culture   Final    NO GROWTH 5 DAYS Performed at [facility], [address], [place], [place] [address]    Report Status 12/29/2021 FINAL  Final  Blood Culture (routine x 2)     Status: None   Collection Time: 12/24/21  9:39 PM   Specimen: BLOOD  Result Value Ref Range Status   Specimen Description BLOOD RIGHT HAND  Final   Special Requests   Final    BOTTLES DRAWN AEROBIC AND ANAEROBIC Blood Culture adequate volume   Culture   Final    NO GROWTH 5 DAYS Performed at [facility], [address], [place], [place] [address]    Report Status 12/29/2021 FINAL  Final  Body fluid culture w Gram Stain     Status: None   Collection Time: 12/25/21 11:57 AM   Specimen: PATH Cytology Pleural fluid  Result Value Ref Range Status   Specimen Description   Final    PLEURAL Performed at [facility], [address] [place], [place] [address]    Special Requests   Final    NONE Performed at [facility], [address] [place], [place] [address]    Gram Stain   Final    MODERATE WBC PRESENT,BOTH PMN AND MONONUCLEAR NO ORGANISMS SEEN    Culture   Final    NO  GROWTH 3 DAYS Performed at [facility], [place] [address], [place], [place] [address]    Report Status 12/28/2021 FINAL  Final     Time coordinating discharge: Over 30 minutes  SIGNED:   Sidney Ace, MD  [facility] 12/29/2021, 10:35 AM Pager   If 7PM-7AM, please contact night-coverage

## 2021-12-30 LAB — CHOLESTEROL, BODY FLUID: Cholesterol, Fluid: 103 mg/dL

## 2022-01-08 ENCOUNTER — Telehealth: Payer: Self-pay

## 2022-01-08 NOTE — Telephone Encounter (Signed)
Received call from employer stating injury to ribs was worker's comp. I explained to her we do not file wc. 01/09/22 visit will either be self pay ($110.00) or we file health insurance. She will let patient know-Toni

## 2022-01-09 ENCOUNTER — Inpatient Hospital Stay: Payer: 59 | Admitting: Nurse Practitioner

## 2022-02-03 DIAGNOSIS — J90 Pleural effusion, not elsewhere classified: Secondary | ICD-10-CM | POA: Diagnosis not present

## 2022-02-03 DIAGNOSIS — E782 Mixed hyperlipidemia: Secondary | ICD-10-CM | POA: Diagnosis not present

## 2022-02-03 DIAGNOSIS — N486 Induration penis plastica: Secondary | ICD-10-CM | POA: Diagnosis not present

## 2022-02-03 DIAGNOSIS — I10 Essential (primary) hypertension: Secondary | ICD-10-CM | POA: Diagnosis not present

## 2022-02-03 DIAGNOSIS — I2510 Atherosclerotic heart disease of native coronary artery without angina pectoris: Secondary | ICD-10-CM | POA: Diagnosis not present

## 2022-02-03 DIAGNOSIS — Z9861 Coronary angioplasty status: Secondary | ICD-10-CM | POA: Diagnosis not present

## 2022-02-05 LAB — MISC LABCORP TEST (SEND OUT)
LabCorp test name: 5367
Labcorp test code: 9985

## 2022-03-10 ENCOUNTER — Ambulatory Visit: Payer: Self-pay | Admitting: Internal Medicine

## 2022-04-30 ENCOUNTER — Ambulatory Visit: Payer: Self-pay | Admitting: Nurse Practitioner

## 2022-07-30 ENCOUNTER — Telehealth: Payer: Self-pay | Admitting: *Deleted

## 2022-07-30 NOTE — Chronic Care Management (AMB) (Signed)
  Care Coordination   Note   07/30/2022 Name: Sean Durham MRN: [id number] DOB: 1961-03-02  Sean Durham is a 61 y.o. year old male who sees Sallyanne Kuster, NP for primary care. I reached out to [name] by phone today to offer care coordination services.  [name] was given information about Care Coordination services today including:   The Care Coordination services include support from the care team which includes your Nurse Coordinator, Clinical Social Worker, or Pharmacist.  The Care Coordination team is here to help remove barriers to the health concerns and goals most important to you. Care Coordination services are voluntary, and the patient may decline or stop services at any time by request to their care team member.   Care Coordination Consent Status: Patient agreed to services and verbal consent obtained.   Follow up plan:  Telephone appointment with care coordination team member scheduled for:  08/05/22  Encounter Outcome:  Pt. Scheduled

## 2022-07-30 NOTE — Chronic Care Management (AMB) (Signed)
  Care Coordination  Outreach Note  07/30/2022 Name: KUPONO MARLING MRN: [id number] DOB: 07/29/1961   Care Coordination Outreach Attempts  An unsuccessful telephone outreach was attempted today to offer the patient information about available care coordination services as a benefit of their health plan.   Follow Up Plan:  Additional outreach attempts will be made to offer the patient care coordination information and services.   Encounter Outcome:  No Answer  Gwenevere Ghazi  Care Coordination Care Guide  Direct Dial: [phone]

## 2022-08-04 DIAGNOSIS — I10 Essential (primary) hypertension: Secondary | ICD-10-CM | POA: Diagnosis not present

## 2022-08-04 DIAGNOSIS — R0602 Shortness of breath: Secondary | ICD-10-CM | POA: Diagnosis not present

## 2022-08-04 DIAGNOSIS — I2510 Atherosclerotic heart disease of native coronary artery without angina pectoris: Secondary | ICD-10-CM | POA: Diagnosis not present

## 2022-08-04 DIAGNOSIS — Z9861 Coronary angioplasty status: Secondary | ICD-10-CM | POA: Diagnosis not present

## 2022-08-04 DIAGNOSIS — E782 Mixed hyperlipidemia: Secondary | ICD-10-CM | POA: Diagnosis not present

## 2022-08-05 ENCOUNTER — Ambulatory Visit: Payer: Self-pay | Admitting: *Deleted

## 2022-08-05 NOTE — Patient Instructions (Signed)
Visit Information  Thank you for taking time to visit with me today. Please don't hesitate to contact me if I can be of assistance to you.   Following are the goals we discussed today:   Goals Addressed             This Visit's Progress    "I am concerned about my weight gain"       Care Coordination Interventions: Case Management program discussed SDOH screening completed Confirmed that patient is concerned about recent weight gain, stating that he has an appointment with his provider([facility] on 08/06/22 at 10:30am Patient further confirmed being scheduled for a ultrasound on 09/08/22 Patient discussed a history fluid build between his rib cage and lungs and fears weight gain may be due to fluid retention Patient encouraged to follow up with his provider on 08/06/22 Patient verbalized having no community resource needs at this time but would like follow up from [facility] Follow up scheduled for 08/13/22        Our next appointment is by telephone on 08/13/22 at 11am  Please call the care guide team at [phone] if you need to cancel or reschedule your appointment.   If you are experiencing a Mental Health or Behavioral Health Crisis or need someone to talk to, please call the Suicide and Crisis Lifeline: 988   Patient verbalizes understanding of instructions and care plan provided today and agrees to view in MyChart. Active MyChart status and patient understanding of how to access instructions and care plan via MyChart confirmed with patient.     Telephone follow up appointment with care management team member scheduled for: 08/13/22  Verna Czech, LCSW Clinical Social Worker  [facility] Care Management [phone]

## 2022-08-05 NOTE — Patient Outreach (Signed)
  Care Coordination   Initial Visit Note   08/05/2022 Name: Sean Durham MRN: [id number] DOB: 20-Mar-1961  Sean Durham is a 60 y.o. year old male who sees Sallyanne Kuster, NP for primary care. I spoke with  [name] by phone today.  What matters to the patients health and wellness today?  I am concerned about weight gain    Goals Addressed             This Visit's Progress    "I am concerned about my weight gain"       Care Coordination Interventions: Case Management program discussed SDOH screening completed Confirmed that patient is concerned about recent weight gain, stating that he has an appointment with his provider([facility] on 08/06/22 at 10:30am Patient further confirmed being scheduled for a ultrasound on 09/08/22 Patient discussed a history fluid build between his rib cage and lungs and fears weight gain may be due to fluid retention Patient encouraged to follow up with his provider on 08/06/22 Patient verbalized having [place] community resource needs at this time but would like follow up from [facility] Follow up scheduled for 08/13/22        SDOH assessments and interventions completed:  Yes  SDOH Interventions Today    Flowsheet Row Most Recent Value  SDOH Interventions   Food Insecurity Interventions Intervention Not Indicated  Housing Interventions Intervention Not Indicated  Transportation Interventions Intervention Not Indicated  Utilities Interventions Intervention Not Indicated  Financial Strain Interventions Intervention Not Indicated  Stress Interventions Intervention Not Indicated        Care Coordination Interventions Activated:  Yes  Care Coordination Interventions:  Yes, provided   Follow up plan: Follow up call scheduled for 08/13/22    Encounter Outcome:  Pt. Visit Completed

## 2022-08-06 DIAGNOSIS — Z1212 Encounter for screening for malignant neoplasm of rectum: Secondary | ICD-10-CM | POA: Diagnosis not present

## 2022-08-06 DIAGNOSIS — E782 Mixed hyperlipidemia: Secondary | ICD-10-CM | POA: Diagnosis not present

## 2022-08-06 DIAGNOSIS — I2510 Atherosclerotic heart disease of native coronary artery without angina pectoris: Secondary | ICD-10-CM | POA: Diagnosis not present

## 2022-08-06 DIAGNOSIS — R058 Other specified cough: Secondary | ICD-10-CM | POA: Diagnosis not present

## 2022-08-06 DIAGNOSIS — Z9989 Dependence on other enabling machines and devices: Secondary | ICD-10-CM | POA: Diagnosis not present

## 2022-08-06 DIAGNOSIS — R7309 Other abnormal glucose: Secondary | ICD-10-CM | POA: Diagnosis not present

## 2022-08-06 DIAGNOSIS — R059 Cough, unspecified: Secondary | ICD-10-CM | POA: Diagnosis not present

## 2022-08-06 DIAGNOSIS — Z9861 Coronary angioplasty status: Secondary | ICD-10-CM | POA: Diagnosis not present

## 2022-08-06 DIAGNOSIS — G4733 Obstructive sleep apnea (adult) (pediatric): Secondary | ICD-10-CM | POA: Diagnosis not present

## 2022-08-06 DIAGNOSIS — R972 Elevated prostate specific antigen [PSA]: Secondary | ICD-10-CM | POA: Diagnosis not present

## 2022-08-06 DIAGNOSIS — Z0000 Encounter for general adult medical examination without abnormal findings: Secondary | ICD-10-CM | POA: Diagnosis not present

## 2022-08-06 DIAGNOSIS — I10 Essential (primary) hypertension: Secondary | ICD-10-CM | POA: Diagnosis not present

## 2022-08-06 DIAGNOSIS — Z1211 Encounter for screening for malignant neoplasm of colon: Secondary | ICD-10-CM | POA: Diagnosis not present

## 2022-08-13 ENCOUNTER — Ambulatory Visit: Payer: Self-pay

## 2022-08-13 NOTE — Patient Outreach (Signed)
  Care Coordination   08/13/2022 Name: Sean Durham MRN: [id number] DOB: [birth year]   Care Coordination Outreach Attempts:  An unsuccessful telephone outreach was attempted for a scheduled appointment today.  Follow Up Plan:  Additional outreach attempts will be made to offer the patient care coordination information and services.   Encounter Outcome:  No Answer  Care Coordination Interventions Activated:  No   Care Coordination Interventions:  No, not indicated    Alto Denver RN, MSN, CCM Community Care Coordinator [facility]  [facility] Mobile: [phone]

## 2022-08-17 ENCOUNTER — Telehealth: Payer: Self-pay

## 2022-08-17 NOTE — Chronic Care Management (AMB) (Signed)
  Care Coordination Note  08/17/2022 Name: Sean Durham MRN: [id number] DOB: [birth year]  Sean Durham is a 61 y.o. year old male who is a primary care patient of Jonetta Osgood, NP and is actively engaged with the care management team. I reached out to Anne Fu by phone today to assist with re-scheduling an initial visit with the RN Case Manager  Follow up plan: Unsuccessful telephone outreach attempt made. A HIPAA compliant phone message was left for the patient providing contact information and requesting a return call.  The care management team will reach out to the patient again over the next 7 days.  If patient returns call , please advise to call [facility]  at [place], [place], [place] [address] Direct Dial: [phone] [email or web address]

## 2022-08-19 DIAGNOSIS — R0602 Shortness of breath: Secondary | ICD-10-CM | POA: Diagnosis not present

## 2022-08-19 DIAGNOSIS — I2510 Atherosclerotic heart disease of native coronary artery without angina pectoris: Secondary | ICD-10-CM | POA: Diagnosis not present

## 2022-08-24 NOTE — Chronic Care Management (AMB) (Unsigned)
  Care Coordination  Outreach Note  08/24/2022 Name: Sean Durham MRN: [id number] DOB: 10-04-1961   Care Coordination Outreach Attempts: A second unsuccessful outreach was attempted today to offer the patient with information about available care coordination services as a benefit of their health plan.     Follow Up Plan:  Additional outreach attempts will be made to offer the patient care coordination information and services.   Encounter Outcome:  No Answer  Sig Noreene Larsson, [place], [place] [address] Direct Dial: [phone] [email or web address]

## 2022-08-26 NOTE — Chronic Care Management (AMB) (Signed)
  Care Coordination Note  08/26/2022 Name: Sean Durham MRN: [id number] DOB: 03-Oct-1961  Sean Durham is a 61 y.o. year old male who is a primary care patient of Jonetta Osgood, NP and is actively engaged with the care management team. I reached out to Anne Fu by phone today to assist with re-scheduling an initial visit with the RN Case Manager  Follow up plan: Patient declines further follow up and engagement by the care management team. Appropriate care team members and provider have been notified via electronic communication.   Noreene Larsson, [place], [place] [address] Direct Dial: [phone] [email or web address]

## 2022-08-26 NOTE — Progress Notes (Signed)
Pt declined rs 

## 2022-09-15 DIAGNOSIS — G4733 Obstructive sleep apnea (adult) (pediatric): Secondary | ICD-10-CM | POA: Diagnosis not present

## 2022-09-15 DIAGNOSIS — R0781 Pleurodynia: Secondary | ICD-10-CM | POA: Diagnosis not present

## 2023-01-27 ENCOUNTER — Other Ambulatory Visit: Payer: Self-pay | Admitting: Urology

## 2023-01-27 DIAGNOSIS — N529 Male erectile dysfunction, unspecified: Secondary | ICD-10-CM

## 2023-01-27 NOTE — Telephone Encounter (Signed)
Pt will need an appointment for further refills.

## 2023-03-09 ENCOUNTER — Telehealth: Payer: Self-pay

## 2023-03-10 ENCOUNTER — Ambulatory Visit: Payer: BLUE CROSS/BLUE SHIELD | Admitting: Nurse Practitioner

## 2023-03-10 ENCOUNTER — Encounter: Payer: Self-pay | Admitting: Nurse Practitioner

## 2023-03-10 ENCOUNTER — Ambulatory Visit
Admission: RE | Admit: 2023-03-10 | Discharge: 2023-03-10 | Disposition: A | Discharge status: Home / Self Care | Payer: BLUE CROSS/BLUE SHIELD | Source: Ambulatory Visit | Attending: Nurse Practitioner | Admitting: Nurse Practitioner

## 2023-03-10 ENCOUNTER — Ambulatory Visit
Admission: RE | Admit: 2023-03-10 | Discharge: 2023-03-10 | Disposition: A | Discharge status: Home / Self Care | Payer: BLUE CROSS/BLUE SHIELD | Attending: Nurse Practitioner | Admitting: Nurse Practitioner

## 2023-03-10 VITALS — BP 149/109 | HR 86 | Temp 98.3°F | Resp 16 | Ht 69.0 in | Wt 245.4 lb

## 2023-03-10 DIAGNOSIS — J189 Pneumonia, unspecified organism: Secondary | ICD-10-CM

## 2023-03-10 DIAGNOSIS — R0989 Other specified symptoms and signs involving the circulatory and respiratory systems: Secondary | ICD-10-CM | POA: Diagnosis not present

## 2023-03-10 MED ORDER — BENZONATATE 200 MG PO CAPS: 200.0000 mg | ORAL_CAPSULE | Freq: Three times a day (TID) | ORAL | 0 refills | Status: DC | PRN

## 2023-03-10 MED ORDER — PREDNISONE 10 MG PO TABS: ORAL_TABLET | ORAL | 0 refills | Status: DC

## 2023-03-10 MED ORDER — LEVOFLOXACIN 750 MG PO TABS: 750.0000 mg | ORAL_TABLET | Freq: Every day | ORAL | 0 refills | Status: AC

## 2023-03-10 NOTE — Progress Notes (Signed)
[facility] [address] [place], [place] [address]  Internal MEDICINE  Office Visit Note  Patient Name: Sean Durham  [id number]  [id number]  Date of Service: 03/10/2023  Chief Complaint  Patient presents with   Acute Visit    Sinus infection     HPI Sean Durham presents for an acute sick visit for symptoms of a sinus infection.  Onset of symptoms was about 6 days ago.  -headache, neck sore, sore throat, SOB, cough, chest tightness and audible wheezing Negative for covid  Also have elevated BP-- may be elevated due to illness     Current Medication:  Outpatient Encounter Medications as of 03/10/2023  Medication Sig   acetaminophen (TYLENOL) 325 MG tablet Take 2 tablets (650 mg total) by mouth every 6 (six) hours as needed for mild pain, fever or headache (or Fever >/= 101).   albuterol (VENTOLIN HFA) 108 (90 Base) MCG/ACT inhaler Inhale 2 puffs into the lungs every 6 (six) hours as needed for wheezing or shortness of breath.   Ascorbic Acid 500 MG CHEW Chew 1 tablet by mouth daily.   benzonatate (TESSALON) 200 MG capsule Take 1 capsule (200 mg total) by mouth 3 (three) times daily as needed for cough.   Cholecalciferol 50 MCG (2000 UT) TABS Take 1 tablet by mouth daily.   cyanocobalamin 1000 MCG tablet Take 1,000 mcg by mouth daily.   levofloxacin (LEVAQUIN) 750 MG tablet Take 1 tablet (750 mg total) by mouth daily for 10 days. Take with food   Magnesium Gluconate 550 MG TABS Take 1 tablet by mouth daily.   metoprolol succinate (TOPROL-XL) 25 MG 24 hr tablet Take 12.5 mg by mouth at bedtime.   predniSONE (DELTASONE) 10 MG tablet Take one tab 3 x day for 3 days, then take one tab 2 x a day for 3 days and then take one tab a day for 3 days for copd   zinc gluconate 50 MG tablet Take 1 tablet by mouth daily.   lisinopril (PRINIVIL,ZESTRIL) 10 MG tablet Take 1 tablet by mouth daily.   No facility-administered encounter medications on file as of 03/10/2023.       Medical History: Past Medical History:  Diagnosis Date   Arthritis    Depression    Heart defect    Hypertension    Sleep apnea      Vital Signs: BP (!) 149/109   Pulse 86   Temp 98.3 F (36.8 C)   Resp 16   Ht 5\' 9"  (1.753 m)   Wt 245 lb 6.4 oz (111.3 kg)   SpO2 93%   BMI 36.24 kg/m    Review of Systems  Constitutional:  Positive for chills, fatigue and fever.  HENT:  Positive for postnasal drip, sore throat and trouble swallowing. Negative for congestion, rhinorrhea, sinus pressure and sinus pain.   Respiratory:  Positive for cough, chest tightness, shortness of breath and wheezing.   Cardiovascular:  Negative for chest pain and palpitations.  Musculoskeletal:  Negative for myalgias.  Neurological:  Positive for headaches.    Physical Exam Vitals reviewed.  Constitutional:      General: He is not in acute distress.    Appearance: Normal appearance. He is obese. He is not ill-appearing.  HENT:     Head: Normocephalic and atraumatic.     Nose: Congestion and rhinorrhea present.     Mouth/Throat:     Pharynx: Posterior oropharyngeal erythema present.  Eyes:     Pupils: Pupils are  equal, round, and reactive to light.  Cardiovascular:     Rate and Rhythm: Normal rate and regular rhythm.  Pulmonary:     Effort: No accessory muscle usage or respiratory distress.     Breath sounds: Examination of the right-upper field reveals wheezing and rales. Examination of the left-upper field reveals wheezing and rales. Examination of the right-middle field reveals wheezing and rales. Examination of the left-middle field reveals wheezing and rales. Examination of the right-lower field reveals rhonchi and rales. Examination of the left-lower field reveals rhonchi and rales. Wheezing, rhonchi and rales present.  Neurological:     Mental Status: He is alert and oriented to person, place, and time.  Psychiatric:        Mood and Affect: Mood normal.        Behavior: Behavior  normal.       Assessment/Plan: 1. Community acquired pneumonia, bilateral Chest xray ordered to rule out pneumonia.  Antibiotic, prednisone and benzonatate prescribed.  - DG Chest 2 View; Future - levofloxacin (LEVAQUIN) 750 MG tablet; Take 1 tablet (750 mg total) by mouth daily for 10 days. Take with food  Dispense: 10 tablet; Refill: 0 - predniSONE (DELTASONE) 10 MG tablet; Take one tab 3 x day for 3 days, then take one tab 2 x a day for 3 days and then take one tab a day for 3 days for copd  Dispense: 18 tablet; Refill: 0 - benzonatate (TESSALON) 200 MG capsule; Take 1 capsule (200 mg total) by mouth 3 (three) times daily as needed for cough.  Dispense: 30 capsule; Refill: 0  2. Bibasilar crackles Chest xray ordered.  - DG Chest 2 View; Future - levofloxacin (LEVAQUIN) 750 MG tablet; Take 1 tablet (750 mg total) by mouth daily for 10 days. Take with food  Dispense: 10 tablet; Refill: 0 - predniSONE (DELTASONE) 10 MG tablet; Take one tab 3 x day for 3 days, then take one tab 2 x a day for 3 days and then take one tab a day for 3 days for copd  Dispense: 18 tablet; Refill: 0 - benzonatate (TESSALON) 200 MG capsule; Take 1 capsule (200 mg total) by mouth 3 (three) times daily as needed for cough.  Dispense: 30 capsule; Refill: 0   General Counseling: Sean Durham verbalizes understanding of the findings of todays visit and agrees with plan of treatment. I have discussed any further diagnostic evaluation that may be needed or ordered today. We also reviewed his medications today. he has been encouraged to call the office with any questions or concerns that should arise related to todays visit.    Counseling:    Orders Placed This Encounter  Procedures   DG Chest 2 View    Meds ordered this encounter  Medications   levofloxacin (LEVAQUIN) 750 MG tablet    Sig: Take 1 tablet (750 mg total) by mouth daily for 10 days. Take with food    Dispense:  10 tablet    Refill:  0   predniSONE  (DELTASONE) 10 MG tablet    Sig: Take one tab 3 x day for 3 days, then take one tab 2 x a day for 3 days and then take one tab a day for 3 days for copd    Dispense:  18 tablet    Refill:  0   benzonatate (TESSALON) 200 MG capsule    Sig: Take 1 capsule (200 mg total) by mouth 3 (three) times daily as needed for cough.    Dispense:  30 capsule    Refill:  0    Return for F/U, previously scheduled, Sean Durham PCP for CPE in 2 weeks. .  [place] Controlled Substance Database was reviewed by me for overdose risk score (ORS)  Time spent:30 Minutes Time spent with patient included reviewing progress notes, labs, imaging studies, and discussing plan for follow up.   This patient was seen by Sallyanne Kuster, FNP-C in collaboration with Dr. Beverely Risen as a part of collaborative care agreement.  Rasheena Talmadge R. Tedd Sias, MSN, FNP-C Internal Medicine

## 2023-03-10 NOTE — Telephone Encounter (Signed)
Patient coming in office today. 

## 2023-03-12 ENCOUNTER — Telehealth: Payer: Self-pay

## 2023-03-12 NOTE — Telephone Encounter (Signed)
Left message for patient to give office a call back.  

## 2023-03-15 ENCOUNTER — Telehealth: Payer: Self-pay

## 2023-03-15 NOTE — Telephone Encounter (Signed)
Pt notified for Xray result

## 2023-03-15 NOTE — Telephone Encounter (Signed)
-----   Message from Sallyanne Kuster, NP sent at 03/12/2023  8:01 AM EDT ----- Please let patient know that his chest xray showed a possible developing pneumonia and to continue to take his antibiotic until gone.

## 2023-03-23 ENCOUNTER — Encounter: Payer: Self-pay | Admitting: Nurse Practitioner

## 2023-03-23 ENCOUNTER — Ambulatory Visit (INDEPENDENT_AMBULATORY_CARE_PROVIDER_SITE_OTHER): Payer: BLUE CROSS/BLUE SHIELD | Admitting: Nurse Practitioner

## 2023-03-23 ENCOUNTER — Telehealth: Payer: Self-pay | Admitting: Nurse Practitioner

## 2023-03-23 VITALS — BP 113/75 | HR 91 | Temp 98.3°F | Resp 16 | Ht 69.0 in | Wt 248.0 lb

## 2023-03-23 DIAGNOSIS — M25512 Pain in left shoulder: Secondary | ICD-10-CM

## 2023-03-23 DIAGNOSIS — E782 Mixed hyperlipidemia: Secondary | ICD-10-CM

## 2023-03-23 DIAGNOSIS — E559 Vitamin D deficiency, unspecified: Secondary | ICD-10-CM

## 2023-03-23 DIAGNOSIS — Z0001 Encounter for general adult medical examination with abnormal findings: Secondary | ICD-10-CM | POA: Diagnosis not present

## 2023-03-23 DIAGNOSIS — G8929 Other chronic pain: Secondary | ICD-10-CM

## 2023-03-23 DIAGNOSIS — M25511 Pain in right shoulder: Secondary | ICD-10-CM | POA: Diagnosis not present

## 2023-03-23 DIAGNOSIS — F17210 Nicotine dependence, cigarettes, uncomplicated: Secondary | ICD-10-CM

## 2023-03-23 DIAGNOSIS — R7301 Impaired fasting glucose: Secondary | ICD-10-CM

## 2023-03-23 DIAGNOSIS — R300 Dysuria: Secondary | ICD-10-CM

## 2023-03-23 DIAGNOSIS — E538 Deficiency of other specified B group vitamins: Secondary | ICD-10-CM

## 2023-03-23 DIAGNOSIS — M25522 Pain in left elbow: Secondary | ICD-10-CM | POA: Diagnosis not present

## 2023-03-23 LAB — GLUCOSE, POCT (MANUAL RESULT ENTRY): POC Glucose: 106 mg/dl — AB (ref 70–99)

## 2023-03-23 NOTE — Telephone Encounter (Signed)
Awaiting 03/23/23 office notes for Orthopedic referral-Toni 

## 2023-03-23 NOTE — Progress Notes (Signed)
[facility] [address] [place], [place] [address]  Internal MEDICINE  Office Visit Note  Patient Name: Sean Durham  [id number]  [id number]  Date of Service: 03/23/2023  Chief Complaint  Patient presents with   Annual Exam   Depression   Hypertension   Elbow Pain    Broken bone in right elbow   Quality Metric Gaps    TDAP    HPI Beuford presents for an annual well visit and physical exam.  Well-appearing 62 y.o. male with hypertension, CAD, OSA, high cholesterol, and history of pleural effusion.  Routine CRC screening: declined for now Labs:  due for routine labs  New or worsening pain: shoulders and back for about 1 years.  Takes 2 BP meds and pravastatin for cholesterol.  Quit smoking and drinking 10 years ago. --due for annual lung cancer screening.  Has chronic bilateral shoulder pain and chronic left elbow pain. Due to a prior injury, patient reports that there is a piece of bone floating in his left elbow. --wants to see orthopedic specialist.  Due for tetanus booster   Current Medication: Outpatient Encounter Medications as of 03/23/2023  Medication Sig   acetaminophen (TYLENOL) 325 MG tablet Take 2 tablets (650 mg total) by mouth every 6 (six) hours as needed for mild pain, fever or headache (or Fever >/= 101).   albuterol (VENTOLIN HFA) 108 (90 Base) MCG/ACT inhaler Inhale 2 puffs into the lungs every 6 (six) hours as needed for wheezing or shortness of breath.   Ascorbic Acid 500 MG CHEW Chew 1 tablet by mouth daily.   Cholecalciferol 50 MCG (2000 UT) TABS Take 1 tablet by mouth daily.   cyanocobalamin 1000 MCG tablet Take 1,000 mcg by mouth daily.   Magnesium Gluconate 550 MG TABS Take 1 tablet by mouth daily.   metoprolol succinate (TOPROL-XL) 25 MG 24 hr tablet Take 12.5 mg by mouth at bedtime.   zinc gluconate 50 MG tablet Take 1 tablet by mouth daily.   [DISCONTINUED] benzonatate (TESSALON) 200 MG capsule Take 1 capsule (200 mg total) by mouth 3  (three) times daily as needed for cough.   [DISCONTINUED] predniSONE (DELTASONE) 10 MG tablet Take one tab 3 x day for 3 days, then take one tab 2 x a day for 3 days and then take one tab a day for 3 days for copd   lisinopril (PRINIVIL,ZESTRIL) 10 MG tablet Take 1 tablet by mouth daily.   pravastatin (PRAVACHOL) 20 MG tablet Take 20 mg by mouth daily.   No facility-administered encounter medications on file as of 03/23/2023.    Surgical History: Past Surgical History:  Procedure Laterality Date   CARDIAC SURGERY     stent placement    Medical History: Past Medical History:  Diagnosis Date   Arthritis    Depression    Heart defect    Hypertension    Sleep apnea     Family History: Family History  Problem Relation Age of Onset   Heart disease Mother    Aneurysm Mother        back side of spine   Other Father        fall - broken neck   Emphysema Father    Diabetes Brother     Social History   Socioeconomic History   Marital status: Married    Spouse name: Not on file   Number of children: Not on file   Years of education: Not on file   Highest education  level: Not on file  Occupational History   Not on file  Tobacco Use   Smoking status: Former   Smokeless tobacco: Current    Types: Chew  Vaping Use   Vaping Use: Some days  Substance and Sexual Activity   Alcohol use: Yes    Comment: rarely   Drug use: Yes    Types: Marijuana    Comment: occ.   Sexual activity: Not on file  Other Topics Concern   Not on file  Social History Narrative   Not on file   Social Determinants of Health   Financial Resource Strain: Low Risk  (08/05/2022)   Overall Financial Resource Strain (CARDIA)    Difficulty of Paying Living Expenses: Not hard at all  Food Insecurity: No Food Insecurity (08/05/2022)   Hunger Vital Sign    Worried About Running Out of Food in the Last Year: Never true    Ran Out of Food in the Last Year: Never true  Transportation Needs: No  Transportation Needs (08/05/2022)   PRAPARE - Administrator, Civil Service (Medical): No    Lack of Transportation (Non-Medical): No  Physical Activity: Not on file  Stress: No Stress Concern Present (08/05/2022)   [employer] of Occupational Health - Occupational Stress Questionnaire    Feeling of Stress : Not at all  Social Connections: Not on file  Intimate Partner Violence: Not on file      Review of Systems  Constitutional:  Positive for fatigue. Negative for activity change, appetite change, chills, fever and unexpected weight change.  HENT: Negative.  Negative for congestion, ear pain, rhinorrhea, sore throat and trouble swallowing.   Eyes: Negative.   Respiratory: Negative.  Negative for cough, chest tightness, shortness of breath and wheezing.   Cardiovascular: Negative.  Negative for chest pain and palpitations.  Gastrointestinal: Negative.  Negative for abdominal pain, blood in stool, constipation, diarrhea, nausea and vomiting.  Endocrine: Negative.   Genitourinary: Negative.  Negative for difficulty urinating, dysuria, frequency, hematuria and urgency.  Musculoskeletal:  Positive for arthralgias. Negative for back pain, joint swelling, myalgias and neck pain.  Skin: Negative.  Negative for rash and wound.  Allergic/Immunologic: Negative.  Negative for immunocompromised state.  Neurological: Negative.  Negative for dizziness, seizures, numbness and headaches.  Hematological: Negative.   Psychiatric/Behavioral: Negative.  Negative for behavioral problems, self-injury, sleep disturbance and suicidal ideas. The patient is not nervous/anxious.     Vital Signs: BP 113/75   Pulse 91   Temp 98.3 F (36.8 C)   Resp 16   Ht  (1.753 m)   Wt 248 lb (112.5 kg)   SpO2 98%   BMI 36.62 kg/m    Physical Exam Vitals reviewed.  Constitutional:      General: He is not in acute distress.    Appearance: Normal appearance. He is well-developed and  well-groomed. He is obese. He is not ill-appearing or diaphoretic.  HENT:     Head: Normocephalic and atraumatic.     Right Ear: Tympanic membrane, ear canal and external ear normal. There is no impacted cerumen.     Left Ear: Tympanic membrane, ear canal and external ear normal. There is no impacted cerumen.     Nose: Nose normal. No congestion or rhinorrhea.     Mouth/Throat:     Lips: Pink.     Mouth: Mucous membranes are moist.     Pharynx: Oropharynx is clear. Uvula midline. No oropharyngeal exudate or posterior oropharyngeal erythema.  Eyes:     General: Lids are normal. Vision grossly intact. Gaze aligned appropriately. No scleral icterus.       Right eye: No discharge.        Left eye: No discharge.     Extraocular Movements: Extraocular movements intact.     Conjunctiva/sclera: Conjunctivae normal.     Pupils: Pupils are equal, round, and reactive to light.  Neck:     Thyroid: No thyromegaly.     Vascular: No JVD.     Trachea: Trachea and phonation normal. No tracheal deviation.  Cardiovascular:     Rate and Rhythm: Normal rate and regular rhythm.     Heart sounds: Normal heart sounds, S1 normal and S2 normal. No murmur heard.    No friction rub. No gallop.  Pulmonary:     Effort: Pulmonary effort is normal. No accessory muscle usage or respiratory distress.     Breath sounds: Normal breath sounds and air entry. No stridor. No wheezing or rales.  Chest:     Chest wall: No tenderness.  Abdominal:     General: Bowel sounds are normal. There is no distension.     Palpations: Abdomen is soft. There is no mass.     Tenderness: There is no abdominal tenderness. There is no guarding or rebound.  Musculoskeletal:        General: No tenderness or deformity. Normal range of motion.     Cervical back: Normal range of motion and neck supple.     Right lower leg: No edema.     Left lower leg: No edema.  Lymphadenopathy:     Cervical: No cervical adenopathy.  Skin:    General:  Skin is warm and dry.     Capillary Refill: Capillary refill takes less than 2 seconds.     Coloration: Skin is not pale.     Findings: No erythema or rash.  Neurological:     Mental Status: He is alert and oriented to person, place, and time.     Cranial Nerves: No cranial nerve deficit.     Motor: No abnormal muscle tone.     Coordination: Coordination normal.     Gait: Gait normal.     Deep Tendon Reflexes: Reflexes are normal and symmetric.  Psychiatric:        Attention and Perception: Attention and perception normal.        Mood and Affect: Mood and affect normal.        Behavior: Behavior normal. Behavior is cooperative.        Thought Content: Thought content normal.        Judgment: Judgment normal.        Assessment/Plan: 1. Encounter for routine adult health examination with abnormal findings Age-appropriate preventive screenings and vaccinations discussed, annual physical exam completed. Routine labs for health maintenance ordered, see below. PHM updated.  - CBC with Differential/Platelet - CMP14+EGFR - Lipid Profile - Hgb A1C w/o eAG - B12 and Folate Panel - Vitamin D (25 hydroxy)  2. Chronic elbow pain, left Referred to orthopedic -- [facility] [place] - Ambulatory referral to Orthopedic Surgery  3. Chronic pain of both shoulders Referred to orthopedic - Ambulatory referral to Orthopedic Surgery  4. Impaired fasting glucose Random blood glucose normal at 106 Routine labs ordered - POCT Glucose (CBG) - CBC with Differential/Platelet - CMP14+EGFR - Lipid Profile - Hgb A1C w/o eAG - B12 and Folate Panel  5. Mixed hyperlipidemia Continue pravastatin as prescribed Routine labs  ordered - pravastatin (PRAVACHOL) 20 MG tablet; Take 20 mg by mouth daily. - Lipid Profile - Hgb A1C w/o eAG - B12 and Folate Panel  6. B12 deficiency Routine labs ordered - CBC with Differential/Platelet - B12 and Folate Panel  7. Vitamin D deficiency Routine lab  ordered - Vitamin D (25 hydroxy)  8. Dysuria Routine urinalysis done  - UA/M w/rflx Culture, Routine  9. Smokes less than 2 packs a day with greater than 25 pack year history CT scan ordered  - CT CHEST LUNG CA SCREEN LOW DOSE W/O CM; Future      General Counseling: Elford verbalizes understanding of the findings of todays visit and agrees with plan of treatment. I have discussed any further diagnostic evaluation that may be needed or ordered today. We also reviewed his medications today. he has been encouraged to call the office with any questions or concerns that should arise related to todays visit.    Orders Placed This Encounter  Procedures   CT CHEST LUNG CA SCREEN LOW DOSE W/O CM   UA/M w/rflx Culture, Routine   CBC with Differential/Platelet   CMP14+EGFR   Lipid Profile   Hgb A1C w/o eAG   B12 and Folate Panel   Vitamin D (25 hydroxy)   Ambulatory referral to Orthopedic Surgery   POCT Glucose (CBG)    No orders of the defined types were placed in this encounter.   Return in about 3 months (around 06/22/2023) for please print lab slip for patient, follow up with lab results in 1 month. .   Total time spent:30 Minutes Time spent includes review of chart, medications, test results, and follow up plan with the patient.   [place] Controlled Substance Database was reviewed by me.  This patient was seen by Sallyanne Kuster, FNP-C in collaboration with Dr. Beverely Risen as a part of collaborative care agreement.  Hanish Laraia R. Tedd Sias, MSN, FNP-C Internal medicine

## 2023-03-23 NOTE — Telephone Encounter (Signed)
CT order & authorization faxed to [facility] due to insurance; [phone]

## 2023-03-24 ENCOUNTER — Telehealth: Payer: Self-pay | Admitting: Nurse Practitioner

## 2023-03-24 NOTE — Telephone Encounter (Signed)
Orthopedic referral faxed to [facility] due to insurance ; [phone]-Toni 

## 2023-04-22 ENCOUNTER — Telehealth: Payer: Self-pay | Admitting: Nurse Practitioner

## 2023-04-22 NOTE — Telephone Encounter (Signed)
Lvm to follow up on CT ordered-Toni

## 2023-05-10 ENCOUNTER — Telehealth: Payer: Self-pay | Admitting: Nurse Practitioner

## 2023-05-10 NOTE — Telephone Encounter (Signed)
Patient requested to cancel lung screening CT. He stated other provider said lungs are clear-Toni

## 2023-05-15 IMAGING — US US THORACENTESIS ASP PLEURAL SPACE W/IMG GUIDE
1 series · 3 of 3 positions shown · non-contrast
Comparison: none

INDICATION: Patient presented to the ED with right-sided chest pain, blunt
trauma at work, shortness of breath. Patient found to have a right
pleural effusion. Interventional radiology asked to perform a
diagnostic and therapeutic thoracentesis.

[Series 1: us thoracentesis asp pleural space w/img guide · 3 of 3 slices shown]
[im 1/3]
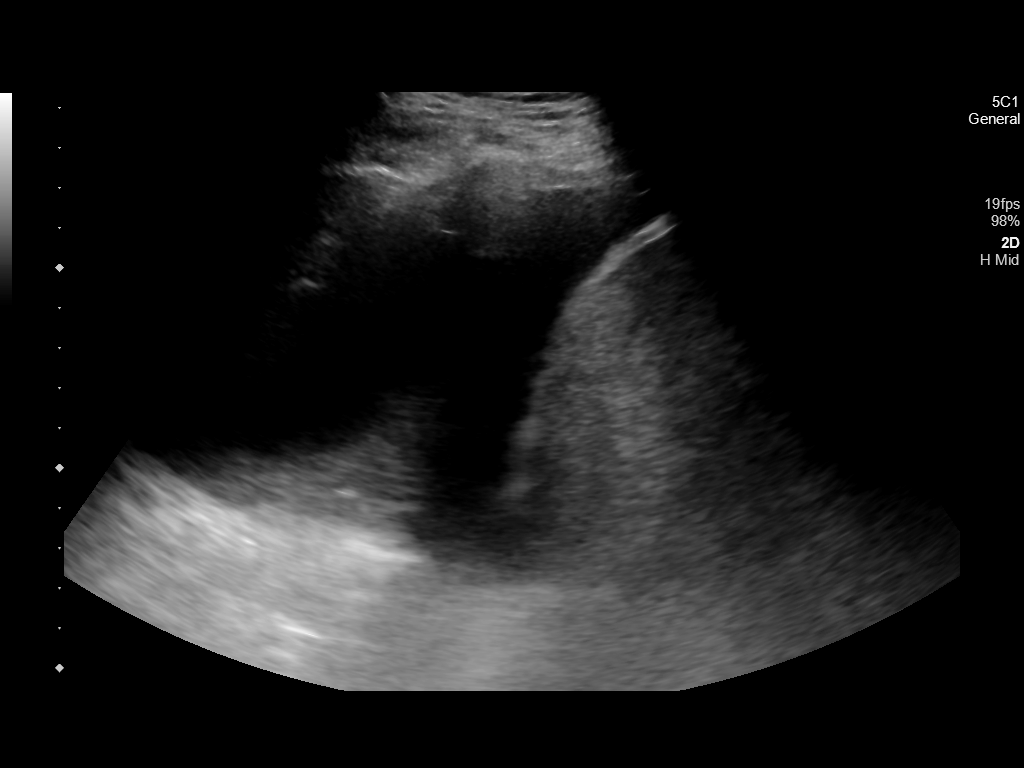
[im 2/3]
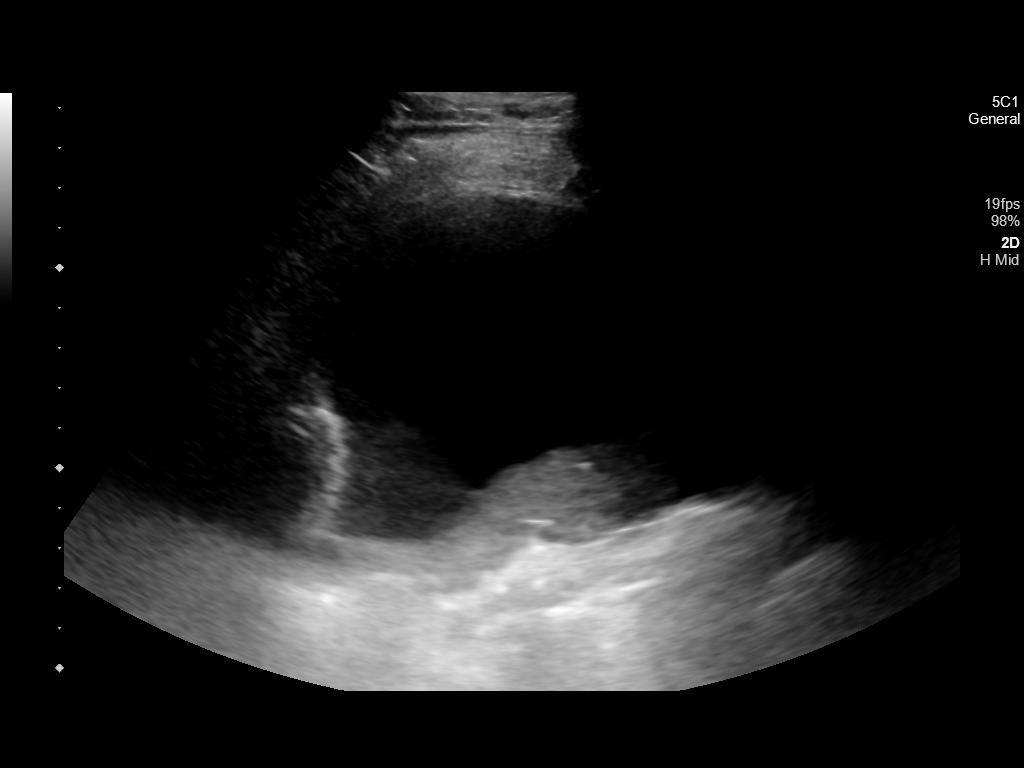
[im 3/3]
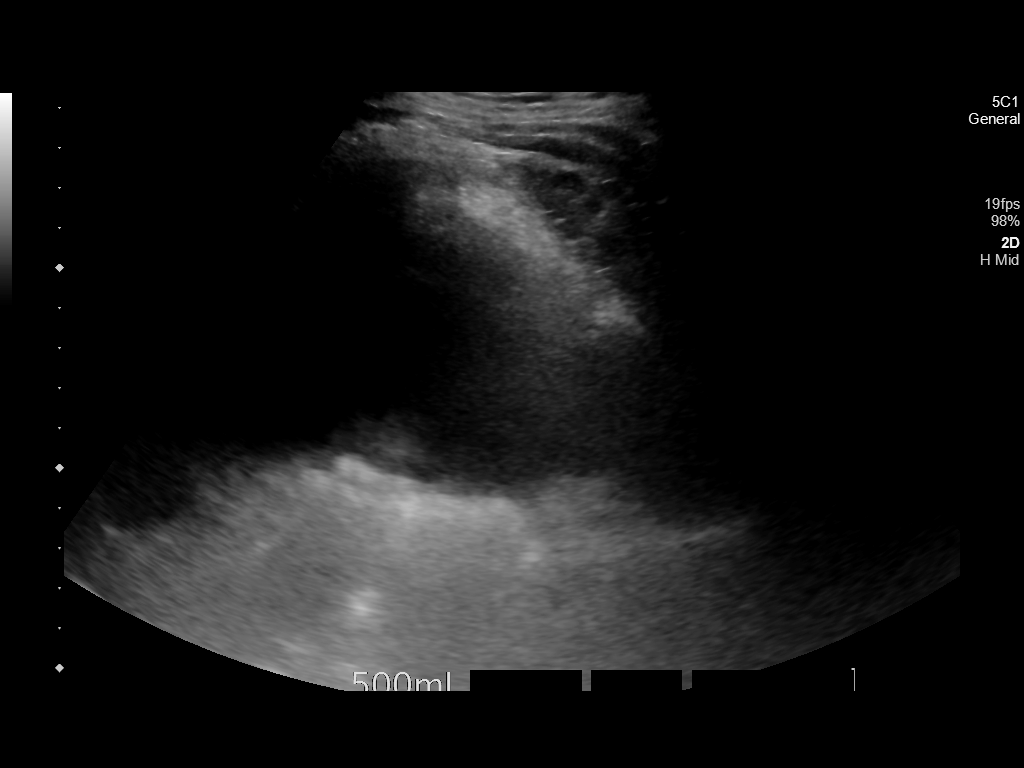

[3 of 3 positions shown; findings below may reference images not displayed]

EXAM:
ULTRASOUND GUIDED THORACENTESIS

MEDICATIONS:
1% lidocaine 10 mL

COMPLICATIONS:
None immediate.

PROCEDURE:
An ultrasound guided thoracentesis was thoroughly discussed with the
patient and questions answered. The benefits, risks, alternatives
and complications were also discussed. The patient understands and
wishes to proceed with the procedure. Written consent was obtained.

Ultrasound was performed to localize and mark an adequate pocket of
fluid in the right chest. The area was then prepped and draped in
the normal sterile fashion. 1% Lidocaine was used for local
anesthesia. Under ultrasound guidance a 6 Fr Safe-T-Centesis
catheter was introduced. Thoracentesis was performed. The procedure
was stopped early due to patient pain/discomfort. The catheter was
removed and a dressing applied.
FINDINGS: A total of approximately 500 mL of amber-colored fluid was removed.
Samples were sent to the laboratory as requested by the clinical
team.
IMPRESSION: Successful ultrasound guided right thoracentesis yielding 500 mL of
pleural fluid. Read by: Crispin Abad, NP

## 2023-05-18 IMAGING — DX DG CHEST 1V PORT
1 series · 1 of 1 positions shown · non-contrast
Comparison: December 25, 2021.

CLINICAL DATA: Right rib pain after injury.

EXAM:
PORTABLE CHEST 1 VIEW

[chest ap]
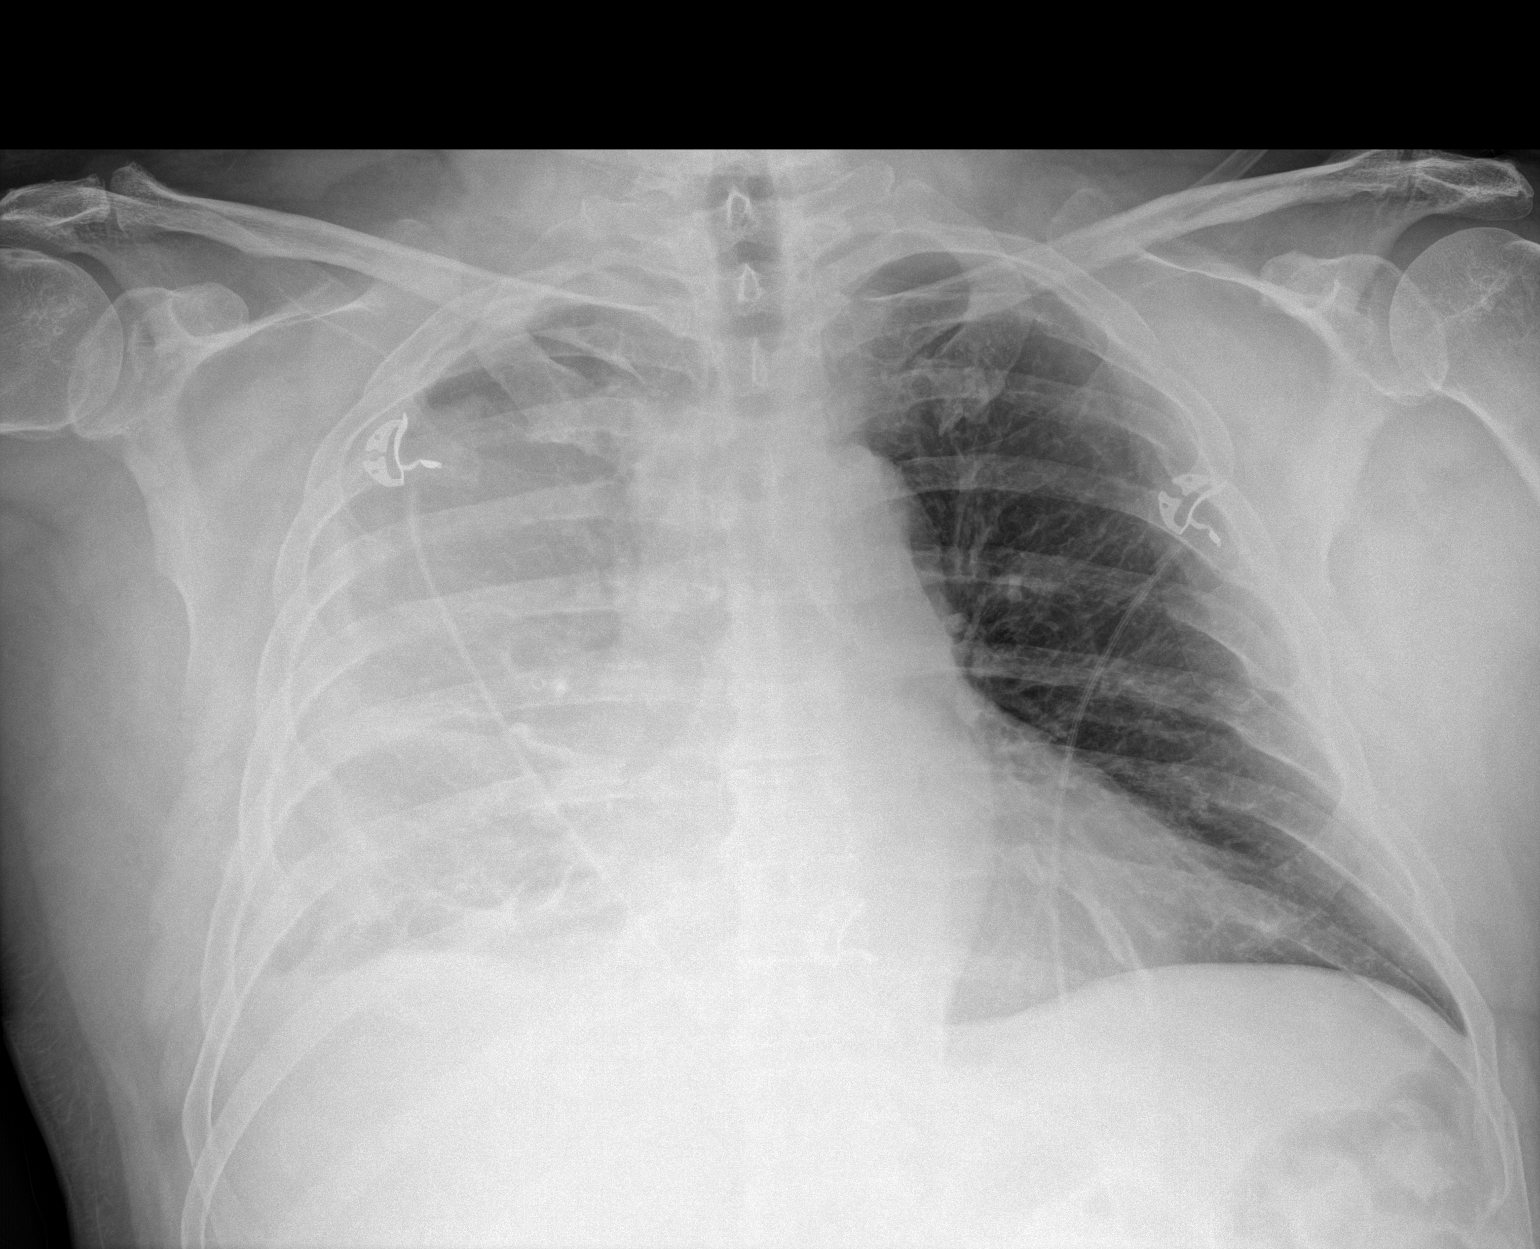

[1 of 1 positions shown; findings below may reference images not displayed]

FINDINGS: The heart size and mediastinal contours are within normal limits.
Left lung is clear. No pneumothorax is noted. Increased diffuse lung
opacity is noted concerning for pneumonia or atelectasis and
associated pleural effusion. Old left rib fractures are noted.
IMPRESSION: Increased right lung opacity is noted concerning for worsening
pneumonia or atelectasis with associated pleural effusion. No
pneumothorax is noted.

## 2023-06-16 ENCOUNTER — Telehealth: Payer: Self-pay | Admitting: Nurse Practitioner

## 2023-06-16 NOTE — Telephone Encounter (Signed)
Lvm regarding 06/17/23 appointment to review labs that have not been done yet-Toni

## 2023-06-17 ENCOUNTER — Ambulatory Visit: Payer: BLUE CROSS/BLUE SHIELD | Admitting: Nurse Practitioner

## 2023-08-19 ENCOUNTER — Other Ambulatory Visit: Payer: Self-pay | Admitting: Nurse Practitioner

## 2023-08-19 DIAGNOSIS — E782 Mixed hyperlipidemia: Secondary | ICD-10-CM

## 2023-08-19 NOTE — Telephone Encounter (Signed)
Please review

## 2023-10-22 ENCOUNTER — Encounter: Payer: Self-pay | Admitting: *Deleted

## 2023-12-13 NOTE — Progress Notes (Signed)
 Established Patient Visit   Chief Complaint: Chief Complaint  Patient presents with  . Follow-up   Date of Service: 12/13/2023 Date of Birth: 1960-12-13 PCP: Provider  History of Present Illness: Sean Durham is a 63 y.o.male patient with a past medical history of coronary artery disease with history of myocardial infarction with bare-metal stent in mid circumflex, OSA not on CPAP, hyperlipidemia, hypertension.  Patient is here today for routine 1 year follow-up visit.  Last seen 07/2022  Overall states he has been doing well although he ran out of his lisinopril  and metoprolol  in the last few weeks and has had worsening hypertension since then. He subsequently noticed worsening leg edema as well. Has been noticing generalized fatigue recently as well. Denies any chest pain or dyspnea on exertion.   Has a history of CAD with stent to mid circumflex in 2014. No recent stress test. Echo 08/04/2022 showed normal LV function EF >55%, no significant valvular abnormalities.  States that he has been on pravastatin  for several years at 20 mg daily.  Had been tried on atorvastatin in the past as well with side effects of myalgias. Feels that he is having fatigue and muscle pain with pravastatin . LDL uncontrolled with last lipid panel showing LDL 93. Unable to up titrate due to concern of side effects at higher doses.   He has a history of OSA currently not on CPAP due to unable to tolerate CPAP mask, followed by Dr. Theotis  Past Medical and Surgical History  Past Medical History Past Medical History:  Diagnosis Date  . Anxiety   . CAD (coronary artery disease)    status-post non ST elevation with myocardial infarction.  status-post PCI with placement of a bare metal stent in the mid circumflex.   SABRA COVID-19 05/2019  . Hyperlipidemia   . Hypertension   . Myocardial infarction (CMS/HHS-HCC)   . Polysubstance abuse (CMS/HHS-HCC)     Past Surgical History He has a past surgical history that includes  S/p stent placement for CAD; Cardiac catheterization; Coronary angioplasty; and percutaneous pleural drainage with insertion indwelling catheter (Right, 01/13/2022).   Medications and Allergies  Current Medications  Current Outpatient Medications on File Prior to Visit  Medication Sig Dispense Refill  . pravastatin  (PRAVACHOL ) 20 MG tablet Take 1 tablet (20 mg total) by mouth at bedtime 30 tablet 11  . albuterol  MDI, PROVENTIL , VENTOLIN , PROAIR , HFA 90 mcg/actuation inhaler Inhale 2 inhalations into the lungs every 6 (six) hours as needed 1 each 1  . Bacillus coagulans (PROBIOTIC, B. COAGULANS, ORAL) Take 1 tablet by mouth once daily (Patient not taking: Reported on 12/13/2023)    . cholecalciferol 1000 unit tablet Take by mouth (Patient not taking: Reported on 12/13/2023)    . MAGNESIUM ORAL Take by mouth as needed (Patient not taking: Reported on 12/13/2023)    . semaglutide  (WEGOVY ) 0.25 mg/0.5 mL pen injector Inject 0.5 mLs (0.25 mg total) subcutaneously once a week (Patient not taking: Reported on 12/13/2023) 2 mL 0  . semaglutide  (WEGOVY ) 0.5 mg/0.5 mL pen injector Inject 0.5 mLs (0.5 mg total) subcutaneously once a week (Patient not taking: Reported on 12/13/2023) 2 mL 2  . VITAMIN B COMPLEX ORAL Take by mouth (Patient not taking: Reported on 12/13/2023)    . ZINC  ORAL Take by mouth (Patient not taking: Reported on 12/13/2023)     No current facility-administered medications on file prior to visit.    Allergies: Aspirin, Statins-hmg-coa reductase inhibitors, and Triamterene-hydrochlorothiazid  Social and Family History  Social History  reports that he quit smoking about 14 years ago. His smoking use included cigarettes. He started smoking about 34 years ago. He has a 20 pack-year smoking history. He quit smokeless tobacco use about 1 years ago.  His smokeless tobacco use included chew. He reports that he does not currently use alcohol . He reports that he does not use drugs.  Family  History Family History  Problem Relation Name Age of Onset  . Heart disease Mother      Review of Systems   Review of Systems  Positive for leg swelling, weight gain  Negative for  weight loss, weakness, vision change, hearing loss, cough, congestion, PND, orthopnea, heartburn, nausea, diaphoresis, vomiting, diarrhea, bloody stool, melena, stomach pain, extremity pain, leg weakness, leg cramping, leg blood clots, headache, blackouts, nosebleed, trouble swallowing, mouth pain, urinary frequency, urination at night, muscle weakness, skin lesions, skin rashes, tingling ,ulcers, numbness, anxiety, and/or depression Physical Examination   Vitals:BP (!) 160/110   Pulse (!) 118   Resp 16   Ht 175.3 cm (5' 9)   Wt (!) 116.1 kg (256 lb)   SpO2 96%   BMI 37.80 kg/m  Ht:175.3 cm (5' 9) Wt:(!) 116.1 kg (256 lb) ADJ:Anib surface area is 2.38 meters squared. Body mass index is 37.8 kg/m. Appearance: well appearing in no acute distress HEENT: Pupils equally reactive to light and accomodation, no xanthalasma   Neck: Supple, no apparent thyromegaly, masses, or lymphadenopathy  Lungs: normal respiratory effort; no crackles, no rhonchi, no wheezes Heart: Regular rate and rhythm. Normal S1 S2  No gallops, murmur, no rub, PMI is normal size and placement. carotid upstroke normal without bruit. Jugular venous pressure is normal Abdomen: soft, nontender, not distended with normal bowel sounds. No apparent hepatosplenomegally. Abdominal aorta is normal size without bruit Extremities: no edema, no ulcers, no clubbing, no cyanosis Peripheral Pulses: 2+ in upper extremities, 2+ femoral pulses bilaterally, 2+lower extremity  Musculoskeletal;  Normal muscle tone without kyphosis Neurological:   Oriented and Alert, Cranial nerves intact  Assessment   63 y.o. male with  Encounter Diagnoses  Name Primary?  . Essential hypertension Yes  . Poorly-controlled hypertension   . CAD S/P percutaneous coronary  angioplasty   . SOBOE (shortness of breath on exertion)   . Coronary artery disease involving native coronary artery of native heart without angina pectoris         Plan   1.  Coronary artery disease: With history of bare-metal stent in mid circumflex with worsening HTN, leg edema and fatigue.   -Myoview for evaluation of possible myocardial ischemia, arrhythmia, chronotropic competence, functional capacity -Continue lisinopril , metoprolol  -Sent in Rx for Praluent, if approved will start this in place of pravastatin   2.  Hypertension: Elevated in the office today 160/110, was out of his medication prior to this appointment -Restart lisinopril , metoprolol  -Will return in approximately 4 weeks for evaluation  3.  Hyperlipidemia: On pravastatin  20 mg daily with side effects of muscle pains, hc of intolerance to atorvastatin as well. LDL not at goal, unable to up titrate due to side effects -Sent in Rx for Praluent, if approved will start this in place of pravastatin     Orders Placed This Encounter  Procedures  . NM myocardial perfusion SPECT multiple (stress and rest)  . ECG 12-lead  . ECG stress test only    Return in about 4 weeks (around 01/10/2024).   Attestation Statement:   I personally performed the service, non-incident to. [facility])  [address] [place], [place]

## 2024-03-16 ENCOUNTER — Telehealth: Payer: Self-pay | Admitting: Nurse Practitioner

## 2024-03-16 NOTE — Telephone Encounter (Signed)
 Left vm to confirm 03/23/24 appointment-Toni

## 2024-03-23 ENCOUNTER — Encounter: Payer: PRIVATE HEALTH INSURANCE | Admitting: Nurse Practitioner

## 2024-06-07 ENCOUNTER — Encounter: Payer: Self-pay | Admitting: Nurse Practitioner

## 2024-06-07 ENCOUNTER — Ambulatory Visit (INDEPENDENT_AMBULATORY_CARE_PROVIDER_SITE_OTHER): Payer: PRIVATE HEALTH INSURANCE | Admitting: Nurse Practitioner

## 2024-06-07 VITALS — BP 136/80 | HR 90 | Temp 98.2°F | Resp 16 | Ht 69.0 in | Wt 252.6 lb

## 2024-06-07 DIAGNOSIS — E1159 Type 2 diabetes mellitus with other circulatory complications: Secondary | ICD-10-CM

## 2024-06-07 DIAGNOSIS — E1169 Type 2 diabetes mellitus with other specified complication: Secondary | ICD-10-CM

## 2024-06-07 DIAGNOSIS — I2510 Atherosclerotic heart disease of native coronary artery without angina pectoris: Secondary | ICD-10-CM

## 2024-06-07 DIAGNOSIS — E785 Hyperlipidemia, unspecified: Secondary | ICD-10-CM

## 2024-06-07 DIAGNOSIS — I10 Essential (primary) hypertension: Secondary | ICD-10-CM

## 2024-06-07 DIAGNOSIS — Z9861 Coronary angioplasty status: Secondary | ICD-10-CM

## 2024-06-07 DIAGNOSIS — Z8679 Personal history of other diseases of the circulatory system: Secondary | ICD-10-CM

## 2024-06-07 DIAGNOSIS — E782 Mixed hyperlipidemia: Secondary | ICD-10-CM

## 2024-06-07 LAB — POCT GLYCOSYLATED HEMOGLOBIN (HGB A1C): Hemoglobin A1C: 6 % — AB (ref 4.0–5.6)

## 2024-06-07 MED ORDER — SEMAGLUTIDE (1 MG/DOSE) 4 MG/3ML ~~LOC~~ SOPN: 1.0000 mg | PEN_INJECTOR | SUBCUTANEOUS | 3 refills | Status: DC

## 2024-06-07 NOTE — Progress Notes (Signed)
 [facility] [address] [place], [place] [address]  Internal MEDICINE  Office Visit Note  Patient Name: Sean Durham  [id number]  [id number]  Date of Service: 06/07/2024  Chief Complaint  Patient presents with   Depression   Hypertension   Follow-up    Discuss weight loss shots    HPI Sean Durham presents for a follow-up visit for type 2 diabetes, CAD, morbid obesity, hypertension and high cholesterol.  Type 2 diabetes -- A1c is 6.5 in January this year. Repeat A1c is better at 6.0 today but patient wants to try GLP-1 medication to control this and help with weight loss  CAD and ASCVD -- patient has heart disease and CAD with previous procedures done including angioplasty and stent placement during cardiac catheterization.  High cholesterol -- LDL is normal at 98 but above recommendations for patients with heart disease which is ideally under 55. Triglycerides are elevated at 274.     Current Medication: Outpatient Encounter Medications as of 06/07/2024  Medication Sig   [START ON 07/05/2024] Semaglutide , 1 MG/DOSE, 4 MG/3ML SOPN Inject 1 mg as directed once a week.   acetaminophen  (TYLENOL ) 325 MG tablet Take 2 tablets (650 mg total) by mouth every 6 (six) hours as needed for mild pain, fever or headache (or Fever >/= 101).   albuterol  (VENTOLIN  HFA) 108 (90 Base) MCG/ACT inhaler Inhale 2 puffs into the lungs every 6 (six) hours as needed for wheezing or shortness of breath.   Ascorbic Acid 500 MG CHEW Chew 1 tablet by mouth daily.   Cholecalciferol 50 MCG (2000 UT) TABS Take 1 tablet by mouth daily.   cyanocobalamin  1000 MCG tablet Take 1,000 mcg by mouth daily.   lisinopril  (PRINIVIL ,ZESTRIL ) 10 MG tablet Take 1 tablet by mouth daily.   Magnesium Gluconate 550 MG TABS Take 1 tablet by mouth daily.   metoprolol  succinate (TOPROL -XL) 25 MG 24 hr tablet Take 12.5 mg by mouth at bedtime.   pravastatin  (PRAVACHOL ) 20 MG tablet Take 1 tablet (20 mg total) by mouth at bedtime    zinc  gluconate 50 MG tablet Take 1 tablet by mouth daily.   No facility-administered encounter medications on file as of 06/07/2024.    Surgical History: Past Surgical History:  Procedure Laterality Date   CARDIAC SURGERY     stent placement    Medical History: Past Medical History:  Diagnosis Date   Arthritis    Depression    Heart defect    Hypertension    Sleep apnea     Family History: Family History  Problem Relation Age of Onset   Heart disease Mother    Aneurysm Mother        back side of spine   Other Father        fall - broken neck   Emphysema Father    Diabetes Brother     Social History   Socioeconomic History   Marital status: Married    Spouse name: Not on file   Number of children: Not on file   Years of education: Not on file   Highest education level: Not on file  Occupational History   Not on file  Tobacco Use   Smoking status: Former   Smokeless tobacco: Current    Types: Chew   Tobacco comments:    Smoked for approximately 25 years, 1-1/2 packs daily. Stopped smoking in 2012.  Vaping Use   Vaping status: Some Days  Substance and Sexual Activity   Alcohol  use:  Yes    Comment: rarely   Drug use: Yes    Types: Marijuana    Comment: occ.   Sexual activity: Not on file  Other Topics Concern   Not on file  Social History Narrative   Not on file   Social Drivers of Health   Financial Resource Strain: Low Risk  (12/29/2023)   Received from [facility]   Overall Financial Resource Strain (CARDIA)    Difficulty of Paying Living Expenses: Not hard at all  Food Insecurity: No Food Insecurity (12/29/2023)   Received from [facility]   Hunger Vital Sign    Within the past 12 months, you worried that your food would run out before you got the money to buy more.: Never true    Within the past 12 months, the food you bought just didn't last and you didn't have money to get more.: Never true  Transportation  Needs: No Transportation Needs (12/29/2023)   Received from [facility] - Transportation    In the past 12 months, has lack of transportation kept you from medical appointments or from getting medications?: No    Lack of Transportation (Non-Medical): No  Physical Activity: Not on file  Stress: No Stress Concern Present (08/05/2022)   [employer] of Occupational [employer] - Occupational Stress Questionnaire    Feeling of Stress : Not at all  Social Connections: Not on file  Intimate Partner Violence: Not on file      Review of Systems  Constitutional:  Positive for fatigue and unexpected weight change. Negative for chills.  HENT:  Negative for congestion, postnasal drip, rhinorrhea, sneezing and sore throat.   Eyes:  Negative for redness.  Respiratory: Negative.  Negative for cough, chest tightness, shortness of breath and wheezing.   Cardiovascular: Negative.  Negative for chest pain and palpitations.  Gastrointestinal:  Negative for abdominal pain, constipation, diarrhea, nausea and vomiting.  Genitourinary:  Negative for dysuria and frequency.  Musculoskeletal:  Positive for arthralgias. Negative for back pain, joint swelling and neck pain.  Skin:  Negative for rash.  Neurological: Negative.  Negative for tremors and numbness.  Hematological:  Negative for adenopathy. Does not bruise/bleed easily.  Psychiatric/Behavioral:  Negative for behavioral problems (Depression), sleep disturbance and suicidal ideas. The patient is not nervous/anxious.     Vital Signs: BP (!) 160/90   Pulse (!) 115   Temp 98.2 F (36.8 C)   Resp 16   Ht 5' 9 (1.753 m)   Wt 252 lb 9.6 oz (114.6 kg)   SpO2 97%   BMI 37.30 kg/m    Physical Exam Vitals reviewed.  Constitutional:      General: He is not in acute distress.    Appearance: Normal appearance. He is obese. He is not ill-appearing.  HENT:     Head: Normocephalic and atraumatic.  Eyes:     Pupils: Pupils are  equal, round, and reactive to light.  Cardiovascular:     Rate and Rhythm: Normal rate and regular rhythm.  Pulmonary:     Effort: Pulmonary effort is normal. No respiratory distress.  Neurological:     Mental Status: He is alert and oriented to person, place, and time.  Psychiatric:        Mood and Affect: Mood normal.        Behavior: Behavior normal.        Assessment/Plan: 1. Type 2 diabetes mellitus with other circulatory complication, without long-term  current use of insulin (HCC) (Primary) Samples given to patient for ozempic , he will use the samples to take the 0.25 mg dose x4 weeks and then 0.5 mg dose for 6 weeks then will increase dose to 1 mg weekly when he picks up his prescription.  - Semaglutide , 1 MG/DOSE, 4 MG/3ML SOPN; Inject 1 mg as directed once a week.  Dispense: 3 mL; Refill: 3 - POCT glycosylated hemoglobin (Hb A1C)  2. CAD S/P percutaneous coronary angioplasty Noted. Patient given ozempic  for diabetes and also to help prevent future cardiovascular events.   3. Hyperlipidemia associated with type 2 diabetes mellitus (HCC) Currently taking pravastatin   4. Primary hypertension Stable, continue medications as prescribed.   5. History of ASCVD Follow up with cardiology    General Counseling: Randine oakland understanding of the findings of todays visit and agrees with plan of treatment. I have discussed any further diagnostic evaluation that may be needed or ordered today. We also reviewed his medications today. he has been encouraged to call the office with any questions or concerns that should arise related to todays visit.    Orders Placed This Encounter  Procedures   POCT glycosylated hemoglobin (Hb A1C)    Meds ordered this encounter  Medications   Semaglutide , 1 MG/DOSE, 4 MG/3ML SOPN    Sig: Inject 1 mg as directed once a week.    Dispense:  3 mL    Refill:  3    Dx code E11.65, patient has been given samples of ozempic  for the lower doses     Return in about 2 months (around 08/08/2024) for F/U, eval new med, Keisean Skowron PCP.   Total time spent:30 Minutes Time spent includes review of chart, medications, test results, and follow up plan with the patient.   [place] Controlled Substance Database was reviewed by me.  This patient was seen by Mardy Maxin, FNP-C in collaboration with Dr. Sigrid Bathe as a part of collaborative care agreement.   Latravis Grine R. Maxin, MSN, FNP-C Internal medicine

## 2024-06-08 DIAGNOSIS — Z8679 Personal history of other diseases of the circulatory system: Secondary | ICD-10-CM | POA: Insufficient documentation

## 2024-06-08 DIAGNOSIS — E119 Type 2 diabetes mellitus without complications: Secondary | ICD-10-CM | POA: Insufficient documentation

## 2024-06-20 ENCOUNTER — Telehealth: Payer: Self-pay

## 2024-06-20 ENCOUNTER — Other Ambulatory Visit: Payer: Self-pay

## 2024-06-20 NOTE — Telephone Encounter (Signed)
 Pharmacy called asking for confirmation of diagnosis of Ozempic  with E11.65 and that is correct.

## 2024-07-12 ENCOUNTER — Other Ambulatory Visit: Payer: Self-pay

## 2024-07-12 DIAGNOSIS — E1159 Type 2 diabetes mellitus with other circulatory complications: Secondary | ICD-10-CM

## 2024-07-12 MED ORDER — SEMAGLUTIDE (1 MG/DOSE) 4 MG/3ML ~~LOC~~ SOPN: 1.0000 mg | PEN_INJECTOR | SUBCUTANEOUS | 3 refills | Status: DC

## 2024-07-28 ENCOUNTER — Telehealth: Payer: Self-pay | Admitting: Nurse Practitioner

## 2024-07-28 NOTE — Telephone Encounter (Addendum)
 Per request, 02/12/23-current MR faxed to [facility]; [phone]

## 2024-08-09 ENCOUNTER — Encounter: Payer: Self-pay | Admitting: Nurse Practitioner

## 2024-08-09 ENCOUNTER — Ambulatory Visit: Payer: PRIVATE HEALTH INSURANCE | Admitting: Nurse Practitioner

## 2024-08-09 VITALS — BP 135/85 | HR 96 | Temp 98.4°F | Resp 16 | Ht 69.0 in | Wt 245.4 lb

## 2024-08-09 DIAGNOSIS — E66812 Obesity, class 2: Secondary | ICD-10-CM

## 2024-08-09 DIAGNOSIS — E785 Hyperlipidemia, unspecified: Secondary | ICD-10-CM

## 2024-08-09 DIAGNOSIS — E1159 Type 2 diabetes mellitus with other circulatory complications: Secondary | ICD-10-CM | POA: Diagnosis not present

## 2024-08-09 DIAGNOSIS — I152 Hypertension secondary to endocrine disorders: Secondary | ICD-10-CM | POA: Diagnosis not present

## 2024-08-09 DIAGNOSIS — E1169 Type 2 diabetes mellitus with other specified complication: Secondary | ICD-10-CM | POA: Diagnosis not present

## 2024-08-09 DIAGNOSIS — I10 Essential (primary) hypertension: Secondary | ICD-10-CM

## 2024-08-09 DIAGNOSIS — Z6836 Body mass index (BMI) 36.0-36.9, adult: Secondary | ICD-10-CM

## 2024-08-09 MED ORDER — LISINOPRIL 20 MG PO TABS: 20.0000 mg | ORAL_TABLET | Freq: Every day | ORAL | 3 refills | Status: AC

## 2024-08-09 MED ORDER — ROSUVASTATIN CALCIUM 5 MG PO TABS: 5.0000 mg | ORAL_TABLET | Freq: Every day | ORAL | 3 refills | Status: DC

## 2024-08-09 MED ORDER — PHENTERMINE HCL 37.5 MG PO TABS: 37.5000 mg | ORAL_TABLET | Freq: Every day | ORAL | 1 refills | Status: DC

## 2024-08-09 MED ORDER — METOPROLOL SUCCINATE ER 25 MG PO TB24: 12.5000 mg | ORAL_TABLET | Freq: Every day | ORAL | 3 refills | Status: AC

## 2024-08-09 NOTE — Progress Notes (Unsigned)
 [facility] [address] [place], [place] [address]  Internal MEDICINE  Office Visit Note  Patient Name: Sean Durham  [id number]  [id number]  Date of Service: 08/09/2024  Chief Complaint  Patient presents with   Depression   Hypertension   Follow-up    HPI Melville presents for a follow-up visit for  Weight loss --  History of MI History of stent placement     Current Medication: Outpatient Encounter Medications as of 08/09/2024  Medication Sig   phentermine  (ADIPEX-P ) 37.5 MG tablet Take 1 tablet (37.5 mg total) by mouth daily before breakfast.   rosuvastatin  (CRESTOR ) 5 MG tablet Take 1 tablet (5 mg total) by mouth daily.   acetaminophen  (TYLENOL ) 325 MG tablet Take 2 tablets (650 mg total) by mouth every 6 (six) hours as needed for mild pain, fever or headache (or Fever >/= 101).   Ascorbic Acid 500 MG CHEW Chew 1 tablet by mouth daily.   aspirin EC 81 MG tablet Take 81 mg by mouth.   Cholecalciferol 50 MCG (2000 UT) TABS Take 1 tablet by mouth daily.   cyanocobalamin  1000 MCG tablet Take 1,000 mcg by mouth daily.   lisinopril  (ZESTRIL ) 20 MG tablet Take 1 tablet (20 mg total) by mouth daily.   Magnesium Gluconate 550 MG TABS Take 1 tablet by mouth daily.   metoprolol  succinate (TOPROL -XL) 25 MG 24 hr tablet Take 0.5 tablets (12.5 mg total) by mouth at bedtime.   zinc  gluconate 50 MG tablet Take 1 tablet by mouth daily.   [DISCONTINUED] albuterol  (VENTOLIN  HFA) 108 (90 Base) MCG/ACT inhaler Inhale 2 puffs into the lungs every 6 (six) hours as needed for wheezing or shortness of breath.   [DISCONTINUED] lisinopril  (ZESTRIL ) 20 MG tablet Take 20 mg by mouth daily.   [DISCONTINUED] metoprolol  succinate (TOPROL -XL) 25 MG 24 hr tablet Take 12.5 mg by mouth at bedtime.   [DISCONTINUED] pravastatin  (PRAVACHOL ) 20 MG tablet Take 1 tablet (20 mg total) by mouth at bedtime   [DISCONTINUED] Semaglutide , 1 MG/DOSE, 4 MG/3ML SOPN Inject 1 mg as directed once a week.    No facility-administered encounter medications on file as of 08/09/2024.    Surgical History: Past Surgical History:  Procedure Laterality Date   CARDIAC SURGERY     stent placement    Medical History: Past Medical History:  Diagnosis Date   Arthritis    Depression    Heart defect    Hypertension    Sleep apnea     Family History: Family History  Problem Relation Age of Onset   Heart disease Mother    Aneurysm Mother        back side of spine   Other Father        fall - broken neck   Emphysema Father    Diabetes Brother     Social History   Socioeconomic History   Marital status: Married    Spouse name: Not on file   Number of children: Not on file   Years of education: Not on file   Highest education level: Not on file  Occupational History   Not on file  Tobacco Use   Smoking status: Former   Smokeless tobacco: Current    Types: Chew   Tobacco comments:    Smoked for approximately 25 years, 1-1/2 packs daily. Stopped smoking in 2012.  Vaping Use   Vaping status: Some Days  Substance and Sexual Activity   Alcohol  use: Yes    Comment:  rarely   Drug use: Yes    Types: Marijuana    Comment: occ.   Sexual activity: Not on file  Other Topics Concern   Not on file  Social History Narrative   Not on file   Social Drivers of Health   Financial Resource Strain: Low Risk  (12/29/2023)   Received from [facility]   Overall Financial Resource Strain (CARDIA)    Difficulty of Paying Living Expenses: Not hard at all  Food Insecurity: No Food Insecurity (12/29/2023)   Received from [facility]   Hunger Vital Sign    Within the past 12 months, you worried that your food would run out before you got the money to buy more.: Never true    Within the past 12 months, the food you bought just didn't last and you didn't have money to get more.: Never true  Transportation Needs: No Transportation Needs (12/29/2023)   Received  from [facility] - Transportation    In the past 12 months, has lack of transportation kept you from medical appointments or from getting medications?: No    Lack of Transportation (Non-Medical): No  Physical Activity: Not on file  Stress: No Stress Concern Present (08/05/2022)   [employer] of Occupational Health - Occupational Stress Questionnaire    Feeling of Stress : Not at all  Social Connections: Not on file  Intimate Partner Violence: Not on file      Review of Systems  Vital Signs: BP (!) 140/108   Pulse 96   Temp 98.4 F (36.9 C)   Resp 16   Ht 5' 9 (1.753 m)   Wt 245 lb 6.4 oz (111.3 kg)   SpO2 98%   BMI 36.24 kg/m    Physical Exam     Assessment/Plan:   General Counseling: Markanthony verbalizes understanding of the findings of todays visit and agrees with plan of treatment. I have discussed any further diagnostic evaluation that may be needed or ordered today. We also reviewed his medications today. he has been encouraged to call the office with any questions or concerns that should arise related to todays visit.    No orders of the defined types were placed in this encounter.   Meds ordered this encounter  Medications   phentermine  (ADIPEX-P ) 37.5 MG tablet    Sig: Take 1 tablet (37.5 mg total) by mouth daily before breakfast.    Dispense:  30 tablet    Refill:  1    May start at 1/2 tablet daily x 1 week, then increase to whole tablet as tolerated.   rosuvastatin  (CRESTOR ) 5 MG tablet    Sig: Take 1 tablet (5 mg total) by mouth daily.    Dispense:  90 tablet    Refill:  3    Discontinue pravastatin  and fill new script today.   lisinopril  (ZESTRIL ) 20 MG tablet    Sig: Take 1 tablet (20 mg total) by mouth daily.    Dispense:  90 tablet    Refill:  3    For future refills, keep on file, patient recently got refilled.   metoprolol  succinate (TOPROL -XL) 25 MG 24 hr tablet    Sig: Take 0.5 tablets (12.5 mg total) by  mouth at bedtime.    Dispense:  45 tablet    Refill:  3    For future refills, keep on file, patient just go refills recently    Return in about 8 weeks (  around 10/04/2024) for F/U, eval new med, Osamah Schmader PCP.   Total time spent:*** Minutes Time spent includes review of chart, medications, test results, and follow up plan with the patient.   [place] Controlled Substance Database was reviewed by me.  This patient was seen by Mardy Maxin, FNP-C in collaboration with Dr. Sigrid Bathe as a part of collaborative care agreement.   Gibril Mastro R. Maxin, MSN, FNP-C Internal medicine

## 2024-08-22 ENCOUNTER — Telehealth: Payer: Self-pay | Admitting: Nurse Practitioner

## 2024-08-22 NOTE — Telephone Encounter (Signed)
 Received message from [facility] stating they did not receive MR I faxed on 07/28/24. Faxed again to [phone]

## 2024-09-16 ENCOUNTER — Encounter: Payer: Self-pay | Admitting: Nurse Practitioner

## 2024-10-04 ENCOUNTER — Ambulatory Visit: Payer: PRIVATE HEALTH INSURANCE | Admitting: Nurse Practitioner

## 2024-11-01 ENCOUNTER — Other Ambulatory Visit: Payer: Self-pay | Admitting: Nurse Practitioner

## 2024-11-01 ENCOUNTER — Telehealth: Payer: Self-pay

## 2024-11-01 DIAGNOSIS — Z6836 Body mass index (BMI) 36.0-36.9, adult: Secondary | ICD-10-CM

## 2024-11-02 MED ORDER — PHENTERMINE HCL 37.5 MG PO TABS: 37.5000 mg | ORAL_TABLET | Freq: Every day | ORAL | 0 refills | Status: DC

## 2024-11-02 NOTE — Telephone Encounter (Signed)
 AA sent.

## 2024-11-10 ENCOUNTER — Encounter: Payer: Self-pay | Admitting: Nurse Practitioner

## 2024-11-10 ENCOUNTER — Ambulatory Visit (INDEPENDENT_AMBULATORY_CARE_PROVIDER_SITE_OTHER): Payer: PRIVATE HEALTH INSURANCE | Admitting: Nurse Practitioner

## 2024-11-10 VITALS — BP 115/75 | HR 90 | Temp 97.9°F | Resp 16 | Ht 69.0 in | Wt 244.2 lb

## 2024-11-10 DIAGNOSIS — E559 Vitamin D deficiency, unspecified: Secondary | ICD-10-CM | POA: Diagnosis not present

## 2024-11-10 DIAGNOSIS — E538 Deficiency of other specified B group vitamins: Secondary | ICD-10-CM

## 2024-11-10 DIAGNOSIS — E1169 Type 2 diabetes mellitus with other specified complication: Secondary | ICD-10-CM | POA: Diagnosis not present

## 2024-11-10 DIAGNOSIS — E1159 Type 2 diabetes mellitus with other circulatory complications: Secondary | ICD-10-CM | POA: Diagnosis not present

## 2024-11-10 DIAGNOSIS — E785 Hyperlipidemia, unspecified: Secondary | ICD-10-CM | POA: Diagnosis not present

## 2024-11-10 DIAGNOSIS — R3911 Hesitancy of micturition: Secondary | ICD-10-CM | POA: Diagnosis not present

## 2024-11-10 DIAGNOSIS — I152 Hypertension secondary to endocrine disorders: Secondary | ICD-10-CM

## 2024-11-10 DIAGNOSIS — Z6836 Body mass index (BMI) 36.0-36.9, adult: Secondary | ICD-10-CM

## 2024-11-10 DIAGNOSIS — R5383 Other fatigue: Secondary | ICD-10-CM

## 2024-11-10 DIAGNOSIS — Z125 Encounter for screening for malignant neoplasm of prostate: Secondary | ICD-10-CM

## 2024-11-10 DIAGNOSIS — E66812 Obesity, class 2: Secondary | ICD-10-CM | POA: Diagnosis not present

## 2024-11-10 MED ORDER — TIRZEPATIDE 5 MG/0.5ML ~~LOC~~ SOAJ: 5.0000 mg | SUBCUTANEOUS | 3 refills | Status: DC

## 2024-11-10 NOTE — Progress Notes (Signed)
 [facility] [address] [place], [place] [address]  Internal MEDICINE  Office Visit Note  Patient Name: Sean Durham  [id number]  [id number]  Date of Service: 11/10/2024  Chief Complaint  Patient presents with   Depression   Hypertension   Follow-up    HPI Sean Durham presents for a follow-up visit for weight loss, heart disease, diabetes.  Weight loss -- has only lsot 1 lbs since last visit but his clothes are fitting looser  On disability due to spinal injuries in the past, multiple compression fractures, heart disease and high cholesterol.  Heart disease -- BP is stable and controlled. Has been doing well on current medication.  Diabetes -- diet controlled. Tried ozempic  in the past but this caused severe constipation. He is willing to try mounjaro    Current Medication: Outpatient Encounter Medications as of 11/10/2024  Medication Sig   tirzepatide (MOUNJARO) 5 MG/0.5ML Pen Inject 5 mg into the skin once a week.   acetaminophen  (TYLENOL ) 325 MG tablet Take 2 tablets (650 mg total) by mouth every 6 (six) hours as needed for mild pain, fever or headache (or Fever >/= 101).   Ascorbic Acid 500 MG CHEW Chew 1 tablet by mouth daily.   aspirin EC 81 MG tablet Take 81 mg by mouth.   Cholecalciferol 50 MCG (2000 UT) TABS Take 1 tablet by mouth daily.   cyanocobalamin  1000 MCG tablet Take 1,000 mcg by mouth daily.   lisinopril  (ZESTRIL ) 20 MG tablet Take 1 tablet (20 mg total) by mouth daily.   Magnesium Gluconate 550 MG TABS Take 1 tablet by mouth daily.   metoprolol  succinate (TOPROL -XL) 25 MG 24 hr tablet Take 0.5 tablets (12.5 mg total) by mouth at bedtime.   phentermine  (ADIPEX-P ) 37.5 MG tablet Take 1 tablet (37.5 mg total) by mouth daily before breakfast.   rosuvastatin  (CRESTOR ) 5 MG tablet Take 1 tablet (5 mg total) by mouth daily.   zinc  gluconate 50 MG tablet Take 1 tablet by mouth daily.   No facility-administered encounter medications on file as of  11/10/2024.    Surgical History: Past Surgical History:  Procedure Laterality Date   CARDIAC SURGERY     stent placement    Medical History: Past Medical History:  Diagnosis Date   Arthritis    Depression    Heart defect    Hypertension    Sleep apnea     Family History: Family History  Problem Relation Age of Onset   Heart disease Mother    Aneurysm Mother        back side of spine   Other Father        fall - broken neck   Emphysema Father    Diabetes Brother     Social History   Socioeconomic History   Marital status: Married    Spouse name: Not on file   Number of children: Not on file   Years of education: Not on file   Highest education level: Not on file  Occupational History   Not on file  Tobacco Use   Smoking status: Former   Smokeless tobacco: Current    Types: Chew   Tobacco comments:    Smoked for approximately 25 years, 1-1/2 packs daily. Stopped smoking in 2012.  Vaping Use   Vaping status: Some Days  Substance and Sexual Activity   Alcohol  use: Not Currently    Comment: rarely   Drug use: Yes    Types: Marijuana  Comment: occ.   Sexual activity: Not on file  Other Topics Concern   Not on file  Social History Narrative   Not on file   Social Drivers of Health   Tobacco Use: High Risk (11/10/2024)   Patient History    Smoking Tobacco Use: Former    Smokeless Tobacco Use: Current    Passive Exposure: Not on file  Financial Resource Strain: Low Risk  (12/29/2023)   Received from [facility]   Overall Financial Resource Strain (CARDIA)    Difficulty of Paying Living Expenses: Not hard at all  Food Insecurity: No Food Insecurity (12/29/2023)   Received from [facility]   Epic    Within the past 12 months, you worried that your food would run out before you got the money to buy more.: Never true    Within the past 12 months, the food you bought just didn't last and you didn't have money to get  more.: Never true  Transportation Needs: No Transportation Needs (12/29/2023)   Received from [facility] - Transportation    In the past 12 months, has lack of transportation kept you from medical appointments or from getting medications?: No    Lack of Transportation (Non-Medical): No  Physical Activity: Not on file  Stress: No Stress Concern Present (08/05/2022)   [employer] - Occupational Stress Questionnaire    Feeling of Stress : Not at all  Social Connections: Not on file  Intimate Partner Violence: Not on file  Depression (PHQ2-9): Low Risk (08/05/2022)   Depression (PHQ2-9)    PHQ-2 Score: 0  Alcohol  Screen: Low Risk (03/23/2023)   Alcohol  Screen    Last Alcohol  Screening Score (AUDIT): 1  Housing: Unknown (12/29/2023)   Received from [facility]   Epic    In the last 12 months, was there a time when you were not able to pay the mortgage or rent on time?: No    Number of Times Moved in the Last Year: Not on file    At any time in the past 12 months, were you homeless or living in a shelter (including now)?: No  Utilities: Not At Risk (12/29/2023)   Received from [facility] Utilities    Threatened with loss of utilities: No  Health Literacy: Not on file      Review of Systems  Constitutional:  Positive for fatigue and unexpected weight change. Negative for chills.  HENT:  Negative for congestion, postnasal drip, rhinorrhea, sneezing and sore throat.   Eyes:  Negative for redness.  Respiratory: Negative.  Negative for cough, chest tightness, shortness of breath and wheezing.   Cardiovascular: Negative.  Negative for chest pain and palpitations.  Gastrointestinal:  Negative for abdominal pain, constipation, diarrhea, nausea and vomiting.  Genitourinary:  Negative for dysuria and frequency.  Musculoskeletal:  Positive for arthralgias. Negative for back pain, joint swelling and  neck pain.  Skin:  Negative for rash.  Neurological: Negative.  Negative for tremors and numbness.  Hematological:  Negative for adenopathy. Does not bruise/bleed easily.  Psychiatric/Behavioral:  Negative for behavioral problems (Depression), sleep disturbance and suicidal ideas. The patient is not nervous/anxious.     Vital Signs: BP 115/75 Comment: 144/98  Pulse 90   Temp 97.9 F (36.6 C)   Resp 16   Ht 5' 9 (1.753 m)   Wt 244 lb 3.2 oz (110.8 kg)  SpO2 95%   BMI 36.06 kg/m    Physical Exam Vitals reviewed.  Constitutional:      General: He is not in acute distress.    Appearance: Normal appearance. He is obese. He is not ill-appearing.  HENT:     Head: Normocephalic and atraumatic.  Eyes:     Pupils: Pupils are equal, round, and reactive to light.  Cardiovascular:     Rate and Rhythm: Normal rate and regular rhythm.  Pulmonary:     Effort: Pulmonary effort is normal. No respiratory distress.  Neurological:     Mental Status: He is alert and oriented to person, place, and time.  Psychiatric:        Mood and Affect: Mood normal.        Behavior: Behavior normal.        Assessment/Plan: 1. Type 2 diabetes mellitus with other circulatory complication, without long-term current use of insulin (HCC) (Primary) Start mounjaro as prescribed. Sample of 2.5 mg for 4 weeks given to patient. Routine labs ordered. Referred to ophthalmology for annual diabetic eye exams.  - tirzepatide (MOUNJARO) 5 MG/0.5ML Pen; Inject 5 mg into the skin once a week.  Dispense: 6 mL; Refill: 3 - CBC with Differential/Platelet - CMP14+EGFR - Lipid Profile - Hgb A1C w/o eAG - Ambulatory referral to Ophthalmology  2. Hypertension associated with diabetes (HCC) Routine labs ordered  - CBC with Differential/Platelet - CMP14+EGFR - Lipid Profile  3. Hyperlipidemia associated with type 2 diabetes mellitus (HCC) Routine lab ordered  - CMP14+EGFR - Lipid Profile  4. Urinary  hesitancy Routine lab ordered - Testosterone,Free and Total  5. Other fatigue Routine lab ordered - Testosterone,Free and Total  6. B12 deficiency Routine labs ordered  - B12 and Folate Panel  7. Vitamin D deficiency Routine lab ordered  - Vitamin D (25 hydroxy)  8. Class 2 severe obesity due to excess calories with serious comorbidity and body mass index (BMI) of 36.0 to 36.9 in adult Starting mounjaro for diabetes which will also help with weight loss.   9. Screening for prostate cancer Routine lab ordered  - PSA Total (Reflex To Free)   General Counseling: Sean Durham understanding of the findings of todays visit and agrees with plan of treatment. I have discussed any further diagnostic evaluation that may be needed or ordered today. We also reviewed his medications today. he has been encouraged to call the office with any questions or concerns that should arise related to todays visit.    Orders Placed This Encounter  Procedures   Testosterone,Free and Total   CBC with Differential/Platelet   CMP14+EGFR   Lipid Profile   B12 and Folate Panel   Vitamin D (25 hydroxy)   PSA Total (Reflex To Free)   Hgb A1C w/o eAG   Ambulatory referral to Ophthalmology    Meds ordered this encounter  Medications   tirzepatide (MOUNJARO) 5 MG/0.5ML Pen    Sig: Inject 5 mg into the skin once a week.    Dispense:  6 mL    Refill:  3    Fill new script today, dx code E11.65    Return in about 1 month (around 12/11/2024) for F/U, Labs, Sean Durham PCP, eval new med.   Total time spent:30 Minutes Time spent includes review of chart, medications, test results, and follow up plan with the patient.   [place] Controlled Substance Database was reviewed by me.  This patient was seen by Sean Maxin, FNP-C in collaboration with Dr. Fozia  Fernand as a part of collaborative care agreement.   Loc Feinstein R. Liana, MSN, FNP-C Internal medicine

## 2024-11-13 ENCOUNTER — Telehealth: Payer: Self-pay | Admitting: Nurse Practitioner

## 2024-11-13 NOTE — Telephone Encounter (Signed)
 Ophthalmology referral sent via [facility] to [facility]. Notified patient. Gave patient telephone # [phone]

## 2024-11-16 LAB — HM DIABETES EYE EXAM

## 2024-11-17 ENCOUNTER — Telehealth: Payer: Self-pay | Admitting: Nurse Practitioner

## 2024-11-17 NOTE — Telephone Encounter (Signed)
 Ophthalmology appointment was12/18/25 with [name]

## 2024-11-21 LAB — CBC WITH DIFFERENTIAL/PLATELET
Basophils Absolute: 0.1 x10E3/uL (ref 0.0–0.2)
Basos: 1 %
EOS (ABSOLUTE): 0.2 x10E3/uL (ref 0.0–0.4)
Eos: 2 %
Hematocrit: 49.3 % (ref 37.5–51.0)
Hemoglobin: 16.1 g/dL (ref 13.0–17.7)
Immature Grans (Abs): 0 x10E3/uL (ref 0.0–0.1)
Immature Granulocytes: 0 %
Lymphocytes Absolute: 2.6 x10E3/uL (ref 0.7–3.1)
Lymphs: 31 %
MCH: 28.3 pg (ref 26.6–33.0)
MCHC: 32.7 g/dL (ref 31.5–35.7)
MCV: 87 fL (ref 79–97)
Monocytes Absolute: 0.9 x10E3/uL (ref 0.1–0.9)
Monocytes: 10 %
Neutrophils Absolute: 4.7 x10E3/uL (ref 1.4–7.0)
Neutrophils: 56 %
Platelets: 381 x10E3/uL (ref 150–450)
RBC: 5.68 x10E6/uL (ref 4.14–5.80)
RDW: 12.8 % (ref 11.6–15.4)
WBC: 8.4 x10E3/uL (ref 3.4–10.8)

## 2024-11-21 LAB — CMP14+EGFR
ALT: 33 IU/L (ref 0–44)
AST: 22 IU/L (ref 0–40)
Albumin: 4.5 g/dL (ref 3.9–4.9)
Alkaline Phosphatase: 71 IU/L (ref 47–123)
BUN/Creatinine Ratio: 11 (ref 10–24)
BUN: 11 mg/dL (ref 8–27)
Bilirubin Total: 0.3 mg/dL (ref 0.0–1.2)
CO2: 23 mmol/L (ref 20–29)
Calcium: 9.9 mg/dL (ref 8.6–10.2)
Chloride: 99 mmol/L (ref 96–106)
Creatinine, Ser: 1.04 mg/dL (ref 0.76–1.27)
Globulin, Total: 2.2 g/dL (ref 1.5–4.5)
Glucose: 82 mg/dL (ref 70–99)
Potassium: 4.7 mmol/L (ref 3.5–5.2)
Sodium: 137 mmol/L (ref 134–144)
Total Protein: 6.7 g/dL (ref 6.0–8.5)
eGFR: 80 mL/min/1.73

## 2024-11-21 LAB — LIPID PANEL
Chol/HDL Ratio: 3.8 ratio (ref 0.0–5.0)
Cholesterol, Total: 152 mg/dL (ref 100–199)
HDL: 40 mg/dL
LDL Chol Calc (NIH): 92 mg/dL (ref 0–99)
Triglycerides: 111 mg/dL (ref 0–149)
VLDL Cholesterol Cal: 20 mg/dL (ref 5–40)

## 2024-11-21 LAB — B12 AND FOLATE PANEL
Folate: 16.6 ng/mL
Vitamin B-12: 519 pg/mL (ref 232–1245)

## 2024-11-21 LAB — HGB A1C W/O EAG: Hgb A1c MFr Bld: 6.1 % — ABNORMAL HIGH (ref 4.8–5.6)

## 2024-11-21 LAB — PSA TOTAL (REFLEX TO FREE): Prostate Specific Ag, Serum: 1.2 ng/mL (ref 0.0–4.0)

## 2024-11-21 LAB — TESTOSTERONE,FREE AND TOTAL
Testosterone, Free: 9.9 pg/mL (ref 6.6–18.1)
Testosterone: 187 ng/dL — ABNORMAL LOW (ref 264–916)

## 2024-11-21 LAB — VITAMIN D 25 HYDROXY (VIT D DEFICIENCY, FRACTURES): Vit D, 25-Hydroxy: 27.8 ng/mL — ABNORMAL LOW (ref 30.0–100.0)

## 2024-12-04 ENCOUNTER — Ambulatory Visit: Payer: Self-pay | Admitting: Nurse Practitioner

## 2024-12-04 NOTE — Progress Notes (Signed)
 The results will be discuss with the patient at his upcoming visit.

## 2024-12-06 ENCOUNTER — Other Ambulatory Visit: Payer: Self-pay

## 2024-12-06 DIAGNOSIS — E1159 Type 2 diabetes mellitus with other circulatory complications: Secondary | ICD-10-CM

## 2024-12-06 MED ORDER — TIRZEPATIDE 5 MG/0.5ML ~~LOC~~ SOAJ: 5.0000 mg | SUBCUTANEOUS | 3 refills | Status: AC

## 2024-12-11 ENCOUNTER — Ambulatory Visit (INDEPENDENT_AMBULATORY_CARE_PROVIDER_SITE_OTHER): Payer: PRIVATE HEALTH INSURANCE | Admitting: Nurse Practitioner

## 2024-12-11 ENCOUNTER — Encounter: Payer: Self-pay | Admitting: Nurse Practitioner

## 2024-12-11 VITALS — BP 124/80 | HR 100 | Temp 97.9°F | Resp 16 | Ht 69.0 in | Wt 247.2 lb

## 2024-12-11 DIAGNOSIS — E1169 Type 2 diabetes mellitus with other specified complication: Secondary | ICD-10-CM | POA: Diagnosis not present

## 2024-12-11 DIAGNOSIS — E785 Hyperlipidemia, unspecified: Secondary | ICD-10-CM

## 2024-12-11 DIAGNOSIS — I152 Hypertension secondary to endocrine disorders: Secondary | ICD-10-CM | POA: Diagnosis not present

## 2024-12-11 DIAGNOSIS — E1159 Type 2 diabetes mellitus with other circulatory complications: Secondary | ICD-10-CM

## 2024-12-11 DIAGNOSIS — G720 Drug-induced myopathy: Secondary | ICD-10-CM

## 2024-12-11 DIAGNOSIS — T466X5A Adverse effect of antihyperlipidemic and antiarteriosclerotic drugs, initial encounter: Secondary | ICD-10-CM | POA: Insufficient documentation

## 2024-12-11 NOTE — Progress Notes (Unsigned)
 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    Current Medication: Outpatient Encounter Medications as of 12/11/2024  Medication Sig   acetaminophen  (TYLENOL ) 325 MG tablet Take 2 tablets (650 mg total) by mouth every 6 (six) hours as needed for mild pain, fever or headache (or Fever >/= 101).   Ascorbic Acid 500 MG CHEW Chew 1 tablet by mouth daily.   aspirin EC 81 MG tablet Take 81 mg by mouth.   Cholecalciferol 50 MCG (2000 UT) TABS Take 1 tablet by mouth daily.   cyanocobalamin  1000 MCG tablet Take 1,000 mcg by mouth daily.   lisinopril  (ZESTRIL ) 20 MG tablet Take 1 tablet (20 mg total) by mouth daily.   Magnesium Gluconate 550 MG TABS Take 1 tablet by mouth daily.   metoprolol  succinate (TOPROL -XL) 25 MG 24 hr tablet Take 0.5 tablets (12.5 mg total) by mouth at bedtime.   tirzepatide  (MOUNJARO ) 5 MG/0.5ML Pen Inject 5 mg into the skin once a week.   zinc  gluconate 50 MG tablet Take 1 tablet by mouth daily.   [DISCONTINUED] phentermine  (ADIPEX-P ) 37.5 MG tablet Take 1 tablet (37.5 mg total) by mouth daily before breakfast.   [DISCONTINUED] rosuvastatin  (CRESTOR ) 5 MG tablet Take 1 tablet (5 mg total) by mouth daily.   No facility-administered encounter medications on file as of 12/11/2024.    Surgical History: Past Surgical History:  Procedure Laterality Date   CARDIAC SURGERY     stent placement    Medical History: Past Medical History:  Diagnosis Date   Arthritis    Depression    Heart defect    Hypertension     Sleep apnea     Family History: Family History  Problem Relation Age of Onset   Heart disease Mother    Aneurysm Mother        back side of spine   Other Father        fall - broken neck   Emphysema Father    Diabetes Brother     Social History   Socioeconomic History   Marital status: Married    Spouse name: Not on file   Number of children: Not on file   Years of education: Not on file   Highest education level: Not on file  Occupational History   Not on file  Tobacco Use   Smoking status: Former   Smokeless tobacco: Current    Types: Chew   Tobacco comments:    Smoked for approximately 25 years, 1-1/2 packs daily. Stopped smoking in 2012.  Vaping Use   Vaping status: Some Days  Substance and Sexual Activity   Alcohol  use: Not Currently    Comment: rarely   Drug use: Yes    Types: Marijuana    Comment: occ.   Sexual activity: Not on file  Other Topics Concern   Not on file  Social History Narrative   Not on file   Social Drivers of Health   Tobacco Use:  High Risk (12/11/2024)   Patient History    Smoking Tobacco Use: Former    Smokeless Tobacco Use: Current    Passive Exposure: Not on file  Financial Resource Strain: Low Risk  (12/29/2023)   Received from [facility]   Overall Financial Resource Strain (CARDIA)    Difficulty of Paying Living Expenses: Not hard at all  Food Insecurity: No Food Insecurity (12/29/2023)   Received from [facility]   Epic    Within the past 12 months, you worried that your food would run out before you got the money to buy more.: Never true    Within the past 12 months, the food you bought just didn't last and you didn't have money to get more.: Never true  Transportation Needs: No Transportation Needs (12/29/2023)   Received from [facility] - Transportation    In the past 12 months, has lack of transportation kept you from medical appointments or from getting  medications?: No    Lack of Transportation (Non-Medical): No  Physical Activity: Not on file  Stress: No Stress Concern Present (08/05/2022)   [employer] - Occupational Stress Questionnaire    Feeling of Stress : Not at all  Social Connections: Not on file  Intimate Partner Violence: Not on file  Depression (PHQ2-9): Low Risk (08/05/2022)   Depression (PHQ2-9)    PHQ-2 Score: 0  Alcohol  Screen: Low Risk (03/23/2023)   Alcohol  Screen    Last Alcohol  Screening Score (AUDIT): 1  Housing: Unknown (12/29/2023)   Received from [facility]   Epic    In the last 12 months, was there a time when you were not able to pay the mortgage or rent on time?: No    Number of Times Moved in the Last Year: Not on file    At any time in the past 12 months, were you homeless or living in a shelter (including now)?: No  Utilities: Not At Risk (12/29/2023)   Received from [facility] Utilities    Threatened with loss of utilities: No  Health Literacy: Not on file      Review of Systems  Constitutional:  Positive for fatigue and unexpected weight change. Negative for chills.  HENT:  Negative for congestion, postnasal drip, rhinorrhea, sneezing and sore throat.   Eyes:  Negative for redness.  Respiratory: Negative.  Negative for cough, chest tightness, shortness of breath and wheezing.   Cardiovascular: Negative.  Negative for chest pain and palpitations.  Gastrointestinal:  Negative for abdominal pain, constipation, diarrhea, nausea and vomiting.  Genitourinary:  Negative for dysuria and frequency.  Musculoskeletal:  Positive for arthralgias. Negative for back pain, joint swelling and neck pain.  Skin:  Negative for rash.  Neurological: Negative.  Negative for tremors and numbness.  Hematological:  Negative for adenopathy. Does not bruise/bleed easily.  Psychiatric/Behavioral:  Negative for behavioral problems (Depression), sleep  disturbance and suicidal ideas. The patient is not nervous/anxious.     Vital Signs: BP 124/80   Pulse 100   Temp 97.9 F (36.6 C)   Resp 16   Ht 5' 9 (1.753 m)   Wt 247 lb 3.2 oz (112.1 kg)   SpO2 95%   BMI 36.51 kg/m    Physical Exam Vitals reviewed.  Constitutional:      General: He is not in acute distress.    Appearance: Normal appearance. He is obese. He  is not ill-appearing.  HENT:     Head: Normocephalic and atraumatic.  Eyes:     Pupils: Pupils are equal, round, and reactive to light.  Cardiovascular:     Rate and Rhythm: Normal rate and regular rhythm.  Pulmonary:     Effort: Pulmonary effort is normal. No respiratory distress.  Neurological:     Mental Status: He is alert and oriented to person, place, and time.  Psychiatric:        Mood and Affect: Mood normal.        Behavior: Behavior normal.        Assessment/Plan: 1. Type 2 diabetes mellitus with other circulatory complication, without long-term current use of insulin (HCC) (Primary) Urine sent to lab for ACR - Urine Microalbumin w/creat. ratio  2. Hypertension associated with diabetes (HCC) Stable, continue medications as prescribed.   3. Hyperlipidemia associated with type 2 diabetes mellitus (HCC) Levels are normal will recheck in a few months without statin  4. Statin myopathy Discontinue statin. Will recheck labs in a few months and reevaluate if need something to replace   General Counseling: Randine oakland understanding of the findings of todays visit and agrees with plan of treatment. I have discussed any further diagnostic evaluation that may be needed or ordered today. We also reviewed his medications today. he has been encouraged to call the office with any questions or concerns that should arise related to todays visit.    Orders Placed This Encounter  Procedures   Urine Microalbumin w/creat. ratio    No orders of the defined types were placed in this  encounter.   Return in about 4 months (around 04/10/2025) for AWV, Madalee Altmann PCP due for first medicare AWV .   Total time spent:30 Minutes Time spent includes review of chart, medications, test results, and follow up plan with the patient.   [place] Controlled Substance Database was reviewed by me.  This patient was seen by Mardy Maxin, FNP-C in collaboration with Dr. Sigrid Bathe as a part of collaborative care agreement.   Jovana Rembold R. Maxin, MSN, FNP-C Internal medicine

## 2024-12-12 LAB — MICROALBUMIN / CREATININE URINE RATIO
Creatinine, Urine: 98.2 mg/dL
Microalb/Creat Ratio: 14 mg/g{creat} (ref 0–29)
Microalbumin, Urine: 14.2 ug/mL

## 2024-12-13 ENCOUNTER — Encounter: Payer: Self-pay | Admitting: Nurse Practitioner

## 2025-04-09 ENCOUNTER — Ambulatory Visit: Admitting: Nurse Practitioner
